# Patient Record
Sex: Female | Born: 1985 | Race: White | Hispanic: No | Marital: Single | Smoking: Current every day smoker
Health system: Southern US, Community
[De-identification: ages and names within clinical notes are randomized; demographics above are authoritative.]

## PROBLEM LIST (undated history)

## (undated) DIAGNOSIS — K259 Gastric ulcer, unspecified as acute or chronic, without hemorrhage or perforation: Secondary | ICD-10-CM

## (undated) DIAGNOSIS — J45909 Unspecified asthma, uncomplicated: Secondary | ICD-10-CM

## (undated) DIAGNOSIS — Z8679 Personal history of other diseases of the circulatory system: Secondary | ICD-10-CM

## (undated) DIAGNOSIS — Z98891 History of uterine scar from previous surgery: Secondary | ICD-10-CM

## (undated) DIAGNOSIS — I10 Essential (primary) hypertension: Secondary | ICD-10-CM

## (undated) HISTORY — DX: Personal history of other diseases of the circulatory system: Z86.79

## (undated) HISTORY — DX: Essential (primary) hypertension: I10

## (undated) HISTORY — PX: ADENOIDECTOMY: SUR15

## (undated) HISTORY — DX: Unspecified asthma, uncomplicated: J45.909

## (undated) HISTORY — PX: INCISION AND DRAINAGE: SHX5863

## (undated) HISTORY — DX: History of uterine scar from previous surgery: Z98.891

## (undated) HISTORY — PX: TONSILLECTOMY: SUR1361

---

## 2004-10-04 ENCOUNTER — Emergency Department: Payer: Self-pay | Admitting: Internal Medicine

## 2004-11-22 ENCOUNTER — Emergency Department: Payer: Self-pay | Admitting: Internal Medicine

## 2004-12-14 ENCOUNTER — Emergency Department: Payer: Self-pay | Admitting: General Practice

## 2005-05-22 ENCOUNTER — Emergency Department: Payer: Self-pay | Admitting: Emergency Medicine

## 2005-06-18 ENCOUNTER — Inpatient Hospital Stay: Payer: Self-pay

## 2005-07-19 ENCOUNTER — Emergency Department: Payer: Self-pay | Admitting: Emergency Medicine

## 2005-07-21 ENCOUNTER — Emergency Department: Payer: Self-pay | Admitting: Emergency Medicine

## 2006-03-05 ENCOUNTER — Emergency Department: Payer: Self-pay | Admitting: Emergency Medicine

## 2008-01-15 ENCOUNTER — Emergency Department: Payer: Self-pay | Admitting: Emergency Medicine

## 2008-01-18 ENCOUNTER — Emergency Department: Payer: Self-pay | Admitting: Emergency Medicine

## 2008-01-18 ENCOUNTER — Other Ambulatory Visit: Payer: Self-pay

## 2008-09-10 ENCOUNTER — Ambulatory Visit: Payer: Self-pay | Admitting: Family Medicine

## 2008-12-30 ENCOUNTER — Observation Stay: Payer: Self-pay | Admitting: Obstetrics & Gynecology

## 2009-03-09 ENCOUNTER — Observation Stay: Payer: Self-pay

## 2009-03-14 ENCOUNTER — Observation Stay: Payer: Self-pay

## 2009-03-17 ENCOUNTER — Inpatient Hospital Stay: Payer: Self-pay | Admitting: Obstetrics & Gynecology

## 2009-03-25 ENCOUNTER — Observation Stay: Payer: Self-pay | Admitting: Obstetrics and Gynecology

## 2010-03-24 ENCOUNTER — Emergency Department: Payer: Self-pay | Admitting: Emergency Medicine

## 2010-06-30 ENCOUNTER — Emergency Department: Payer: Self-pay | Admitting: Emergency Medicine

## 2011-02-19 ENCOUNTER — Emergency Department: Payer: Self-pay | Admitting: Internal Medicine

## 2011-05-24 ENCOUNTER — Ambulatory Visit: Payer: Self-pay | Admitting: Family Medicine

## 2012-01-16 ENCOUNTER — Emergency Department: Payer: Self-pay | Admitting: Emergency Medicine

## 2012-01-16 LAB — URINALYSIS, COMPLETE
Bilirubin,UR: NEGATIVE
Blood: NEGATIVE
Glucose,UR: NEGATIVE mg/dL (ref 0–75)
Specific Gravity: 1.023 (ref 1.003–1.030)
WBC UR: 1 /HPF (ref 0–5)

## 2012-01-16 LAB — COMPREHENSIVE METABOLIC PANEL
Albumin: 3.9 g/dL (ref 3.4–5.0)
Anion Gap: 12 (ref 7–16)
Calcium, Total: 8.9 mg/dL (ref 8.5–10.1)
Chloride: 107 mmol/L (ref 98–107)
Co2: 21 mmol/L (ref 21–32)
EGFR (African American): >60
EGFR (Non-African Amer.): >60
Potassium: 4 mmol/L (ref 3.5–5.1)
SGOT(AST): 17 U/L (ref 15–37)
SGPT (ALT): 13 U/L
Sodium: 140 mmol/L (ref 136–145)
Total Protein: 7.8 g/dL (ref 6.4–8.2)

## 2012-01-16 LAB — CBC
HCT: 42.6 % (ref 35.0–47.0)
MCHC: 34.1 g/dL (ref 32.0–36.0)
MCV: 90 fL (ref 80–100)
RBC: 4.72 10*6/uL (ref 3.80–5.20)
WBC: 8.3 10*3/uL (ref 3.6–11.0)

## 2012-01-19 ENCOUNTER — Emergency Department: Payer: Self-pay | Admitting: Emergency Medicine

## 2012-01-19 LAB — LIPASE, BLOOD: Lipase: 47 U/L — ABNORMAL LOW (ref 73–393)

## 2012-01-19 LAB — URINALYSIS, COMPLETE
Glucose,UR: NEGATIVE mg/dL (ref 0–75)
Leukocyte Esterase: NEGATIVE
Nitrite: NEGATIVE
Ph: 5 (ref 4.5–8.0)
Squamous Epithelial: 1
WBC UR: 1 /HPF (ref 0–5)

## 2012-01-19 LAB — COMPREHENSIVE METABOLIC PANEL
Alkaline Phosphatase: 74 U/L (ref 50–136)
Anion Gap: 12 (ref 7–16)
Bilirubin,Total: 0.3 mg/dL (ref 0.2–1.0)
Chloride: 106 mmol/L (ref 98–107)
Co2: 21 mmol/L (ref 21–32)
EGFR (African American): >60
Osmolality: 281 (ref 275–301)
SGOT(AST): 14 U/L — ABNORMAL LOW (ref 15–37)
SGPT (ALT): 15 U/L
Sodium: 139 mmol/L (ref 136–145)
Total Protein: 8.8 g/dL — ABNORMAL HIGH (ref 6.4–8.2)

## 2012-01-19 LAB — CBC
HCT: 47.2 % — ABNORMAL HIGH (ref 35.0–47.0)
HGB: 15.9 g/dL (ref 12.0–16.0)
MCH: 30.2 pg (ref 26.0–34.0)
MCV: 90 fL (ref 80–100)
Platelet: 263 10*3/uL (ref 150–440)
RBC: 5.28 10*6/uL — ABNORMAL HIGH (ref 3.80–5.20)
RDW: 14.2 % (ref 11.5–14.5)

## 2012-01-19 LAB — AMYLASE: Amylase: 32 U/L (ref 25–115)

## 2013-02-07 ENCOUNTER — Emergency Department: Payer: Self-pay | Admitting: Emergency Medicine

## 2013-02-07 LAB — CBC
HCT: 45.5 % (ref 35.0–47.0)
HGB: 15.4 g/dL (ref 12.0–16.0)
Platelet: 294 10*3/uL (ref 150–440)
RBC: 5.04 10*6/uL (ref 3.80–5.20)
RDW: 13.5 % (ref 11.5–14.5)
WBC: 16.3 10*3/uL — ABNORMAL HIGH (ref 3.6–11.0)

## 2013-02-07 LAB — URINALYSIS, COMPLETE
Blood: NEGATIVE
Glucose,UR: NEGATIVE mg/dL (ref 0–75)
Ketone: NEGATIVE
Leukocyte Esterase: NEGATIVE
Nitrite: NEGATIVE
Ph: 6 (ref 4.5–8.0)
RBC,UR: 1 /HPF (ref 0–5)
Specific Gravity: 1.027 (ref 1.003–1.030)
Squamous Epithelial: 3

## 2013-02-07 LAB — COMPREHENSIVE METABOLIC PANEL
Anion Gap: 8 (ref 7–16)
Co2: 24 mmol/L (ref 21–32)
Creatinine: 0.91 mg/dL (ref 0.60–1.30)
Glucose: 123 mg/dL — ABNORMAL HIGH (ref 65–99)
Potassium: 3.7 mmol/L (ref 3.5–5.1)
SGOT(AST): 12 U/L — ABNORMAL LOW (ref 15–37)
Sodium: 137 mmol/L (ref 136–145)
Total Protein: 8.4 g/dL — ABNORMAL HIGH (ref 6.4–8.2)

## 2013-02-08 ENCOUNTER — Emergency Department: Payer: Self-pay | Admitting: Emergency Medicine

## 2013-02-08 LAB — COMPREHENSIVE METABOLIC PANEL
Alkaline Phosphatase: 67 U/L (ref 50–136)
Anion Gap: 8 (ref 7–16)
BUN: 16 mg/dL (ref 7–18)
Bilirubin,Total: 0.4 mg/dL (ref 0.2–1.0)
Calcium, Total: 8.8 mg/dL (ref 8.5–10.1)
Co2: 26 mmol/L (ref 21–32)
EGFR (African American): >60
EGFR (Non-African Amer.): >60
Potassium: 3.6 mmol/L (ref 3.5–5.1)
Sodium: 137 mmol/L (ref 136–145)
Total Protein: 8.2 g/dL (ref 6.4–8.2)

## 2013-02-08 LAB — CBC
HGB: 15.4 g/dL (ref 12.0–16.0)
MCH: 30.3 pg (ref 26.0–34.0)
MCHC: 33.5 g/dL (ref 32.0–36.0)
Platelet: 280 10*3/uL (ref 150–440)

## 2013-02-08 LAB — URINALYSIS, COMPLETE
Blood: NEGATIVE
Ph: 6 (ref 4.5–8.0)
Protein: 30
RBC,UR: <1 /HPF (ref 0–5)
Squamous Epithelial: 2
WBC UR: 4 /HPF (ref 0–5)

## 2013-10-31 ENCOUNTER — Emergency Department: Payer: Self-pay | Admitting: Urology

## 2013-10-31 LAB — COMPREHENSIVE METABOLIC PANEL
ALK PHOS: 79 U/L
ALT: 17 U/L (ref 12–78)
AST: 9 U/L — AB (ref 15–37)
Albumin: 4.2 g/dL (ref 3.4–5.0)
Anion Gap: 6 — ABNORMAL LOW (ref 7–16)
BUN: 17 mg/dL (ref 7–18)
Bilirubin,Total: 0.5 mg/dL (ref 0.2–1.0)
CHLORIDE: 104 mmol/L (ref 98–107)
CO2: 26 mmol/L (ref 21–32)
CREATININE: 0.97 mg/dL (ref 0.60–1.30)
Calcium, Total: 9.4 mg/dL (ref 8.5–10.1)
EGFR (African American): >60
Glucose: 163 mg/dL — ABNORMAL HIGH (ref 65–99)
Osmolality: 277 (ref 275–301)
Potassium: 3.5 mmol/L (ref 3.5–5.1)
Sodium: 136 mmol/L (ref 136–145)
TOTAL PROTEIN: 8.6 g/dL — AB (ref 6.4–8.2)

## 2013-10-31 LAB — URINALYSIS, COMPLETE
BILIRUBIN, UR: NEGATIVE
Bacteria: NONE SEEN
Blood: NEGATIVE
GLUCOSE, UR: NEGATIVE mg/dL (ref 0–75)
LEUKOCYTE ESTERASE: NEGATIVE
NITRITE: NEGATIVE
Ph: 5 (ref 4.5–8.0)
RBC,UR: 1 /HPF (ref 0–5)
SPECIFIC GRAVITY: 1.029 (ref 1.003–1.030)
Squamous Epithelial: 3
WBC UR: 5 /HPF (ref 0–5)

## 2013-10-31 LAB — CBC
HCT: 44.9 % (ref 35.0–47.0)
HGB: 15.3 g/dL (ref 12.0–16.0)
MCH: 30.4 pg (ref 26.0–34.0)
MCHC: 34.1 g/dL (ref 32.0–36.0)
MCV: 89 fL (ref 80–100)
Platelet: 318 10*3/uL (ref 150–440)
RBC: 5.04 10*6/uL (ref 3.80–5.20)
RDW: 13.8 % (ref 11.5–14.5)
WBC: 15.7 10*3/uL — AB (ref 3.6–11.0)

## 2013-10-31 LAB — HCG, QUANTITATIVE, PREGNANCY: Beta Hcg, Quant.: <1 m[IU]/mL — ABNORMAL LOW

## 2013-10-31 LAB — BETA-HYDROXYBUTYRIC ACID: BETA-HYDROXYBUTYRATE: 1.61 mg/dL (ref 0.2–2.8)

## 2013-10-31 LAB — LIPASE, BLOOD: LIPASE: 59 U/L — AB (ref 73–393)

## 2013-11-04 ENCOUNTER — Emergency Department: Payer: Self-pay | Admitting: Emergency Medicine

## 2013-11-04 LAB — COMPREHENSIVE METABOLIC PANEL
ANION GAP: 6 — AB (ref 7–16)
Albumin: 4 g/dL (ref 3.4–5.0)
Alkaline Phosphatase: 66 U/L
BILIRUBIN TOTAL: 0.3 mg/dL (ref 0.2–1.0)
BUN: 16 mg/dL (ref 7–18)
CO2: 27 mmol/L (ref 21–32)
Calcium, Total: 9 mg/dL (ref 8.5–10.1)
Chloride: 102 mmol/L (ref 98–107)
Creatinine: 0.93 mg/dL (ref 0.60–1.30)
EGFR (African American): >60
EGFR (Non-African Amer.): >60
GLUCOSE: 112 mg/dL — AB (ref 65–99)
Osmolality: 272 (ref 275–301)
POTASSIUM: 3.6 mmol/L (ref 3.5–5.1)
SGOT(AST): 15 U/L (ref 15–37)
SGPT (ALT): 17 U/L (ref 12–78)
Sodium: 135 mmol/L — ABNORMAL LOW (ref 136–145)
Total Protein: 7.8 g/dL (ref 6.4–8.2)

## 2013-11-04 LAB — CBC WITH DIFFERENTIAL/PLATELET
Basophil #: 0.1 10*3/uL (ref 0.0–0.1)
Basophil %: 0.5 %
EOS ABS: 0.1 10*3/uL (ref 0.0–0.7)
EOS PCT: 0.4 %
HCT: 47.8 % — ABNORMAL HIGH (ref 35.0–47.0)
HGB: 15.8 g/dL (ref 12.0–16.0)
Lymphocyte #: 1.5 10*3/uL (ref 1.0–3.6)
Lymphocyte %: 10.8 %
MCH: 29.8 pg (ref 26.0–34.0)
MCHC: 33.1 g/dL (ref 32.0–36.0)
MCV: 90 fL (ref 80–100)
MONO ABS: 1 x10 3/mm — AB (ref 0.2–0.9)
Monocyte %: 7.1 %
NEUTROS ABS: 11 10*3/uL — AB (ref 1.4–6.5)
Neutrophil %: 81.2 %
PLATELETS: 280 10*3/uL (ref 150–440)
RBC: 5.3 10*6/uL — ABNORMAL HIGH (ref 3.80–5.20)
RDW: 13.4 % (ref 11.5–14.5)
WBC: 13.6 10*3/uL — ABNORMAL HIGH (ref 3.6–11.0)

## 2013-11-04 LAB — LIPASE, BLOOD: Lipase: 70 U/L — ABNORMAL LOW (ref 73–393)

## 2015-07-20 ENCOUNTER — Encounter: Payer: Self-pay | Admitting: Emergency Medicine

## 2015-07-20 ENCOUNTER — Emergency Department
Admission: EM | Admit: 2015-07-20 | Discharge: 2015-07-20 | Discharge status: Against Medical Advice | Payer: Self-pay | Attending: Emergency Medicine | Admitting: Emergency Medicine

## 2015-07-20 DIAGNOSIS — Z720 Tobacco use: Secondary | ICD-10-CM | POA: Insufficient documentation

## 2015-07-20 DIAGNOSIS — K5900 Constipation, unspecified: Secondary | ICD-10-CM | POA: Insufficient documentation

## 2015-07-20 NOTE — ED Notes (Signed)
Called no answer in lobby  

## 2015-07-20 NOTE — ED Notes (Signed)
Pt to ed with c/o constipation x 2 days.

## 2016-07-28 ENCOUNTER — Encounter: Payer: Self-pay | Admitting: Emergency Medicine

## 2016-07-28 ENCOUNTER — Emergency Department
Admission: EM | Admit: 2016-07-28 | Discharge: 2016-07-28 | Disposition: A | Discharge status: Home / Self Care | Payer: Self-pay | Attending: Emergency Medicine | Admitting: Emergency Medicine

## 2016-07-28 DIAGNOSIS — R112 Nausea with vomiting, unspecified: Secondary | ICD-10-CM | POA: Insufficient documentation

## 2016-07-28 DIAGNOSIS — F17200 Nicotine dependence, unspecified, uncomplicated: Secondary | ICD-10-CM | POA: Insufficient documentation

## 2016-07-28 DIAGNOSIS — R1084 Generalized abdominal pain: Secondary | ICD-10-CM | POA: Insufficient documentation

## 2016-07-28 DIAGNOSIS — R197 Diarrhea, unspecified: Secondary | ICD-10-CM | POA: Insufficient documentation

## 2016-07-28 DIAGNOSIS — Z5321 Procedure and treatment not carried out due to patient leaving prior to being seen by health care provider: Secondary | ICD-10-CM | POA: Insufficient documentation

## 2016-07-28 HISTORY — DX: Gastric ulcer, unspecified as acute or chronic, without hemorrhage or perforation: K25.9

## 2016-07-28 LAB — COMPREHENSIVE METABOLIC PANEL
ALBUMIN: 4.4 g/dL (ref 3.5–5.0)
ALT: 12 U/L — AB (ref 14–54)
AST: 17 U/L (ref 15–41)
Alkaline Phosphatase: 64 U/L (ref 38–126)
Anion gap: 9 (ref 5–15)
BUN: 12 mg/dL (ref 6–20)
CHLORIDE: 106 mmol/L (ref 101–111)
CO2: 24 mmol/L (ref 22–32)
CREATININE: 0.76 mg/dL (ref 0.44–1.00)
Calcium: 9.4 mg/dL (ref 8.9–10.3)
GFR calc Af Amer: >60 mL/min (ref >60)
GFR calc non Af Amer: >60 mL/min (ref >60)
Glucose, Bld: 132 mg/dL — ABNORMAL HIGH (ref 65–99)
Potassium: 3.8 mmol/L (ref 3.5–5.1)
SODIUM: 139 mmol/L (ref 135–145)
Total Bilirubin: 0.7 mg/dL (ref 0.3–1.2)
Total Protein: 8.1 g/dL (ref 6.5–8.1)

## 2016-07-28 LAB — URINALYSIS COMPLETE WITH MICROSCOPIC (ARMC ONLY)
BILIRUBIN URINE: NEGATIVE
Bacteria, UA: NONE SEEN
GLUCOSE, UA: NEGATIVE mg/dL
Hgb urine dipstick: NEGATIVE
Leukocytes, UA: NEGATIVE
Nitrite: NEGATIVE
PH: 5 (ref 5.0–8.0)
Protein, ur: 30 mg/dL — AB
Specific Gravity, Urine: 1.027 (ref 1.005–1.030)

## 2016-07-28 LAB — CBC
HCT: 46.2 % (ref 35.0–47.0)
Hemoglobin: 15.8 g/dL (ref 12.0–16.0)
MCH: 30.2 pg (ref 26.0–34.0)
MCHC: 34.2 g/dL (ref 32.0–36.0)
MCV: 88.3 fL (ref 80.0–100.0)
PLATELETS: 266 10*3/uL (ref 150–440)
RBC: 5.23 MIL/uL — ABNORMAL HIGH (ref 3.80–5.20)
RDW: 14.1 % (ref 11.5–14.5)
WBC: 10.9 10*3/uL (ref 3.6–11.0)

## 2016-07-28 LAB — LIPASE, BLOOD: LIPASE: 12 U/L (ref 11–51)

## 2016-07-28 MED ORDER — ONDANSETRON 4 MG PO TBDP
4.0000 mg | ORAL_TABLET | Freq: Once | ORAL | Status: AC | PRN
  Administered 2016-07-28: 4 mg via ORAL

## 2016-07-28 MED ORDER — ONDANSETRON 4 MG PO TBDP
ORAL_TABLET | ORAL | Status: AC
  Filled 2016-07-28: qty 1

## 2016-07-28 NOTE — ED Triage Notes (Signed)
Pt presents to ED with reports of generalized abdominal pain, nausea, vomiting and bloody diarrhea. Pt states she first noticed blood in diarrhea this morning.

## 2016-07-28 NOTE — ED Notes (Signed)
Pt called x4 in waiting room and subwait with no answer.

## 2020-02-22 ENCOUNTER — Other Ambulatory Visit: Payer: Self-pay

## 2020-02-22 ENCOUNTER — Encounter: Payer: Self-pay | Admitting: Emergency Medicine

## 2020-02-22 DIAGNOSIS — F17210 Nicotine dependence, cigarettes, uncomplicated: Secondary | ICD-10-CM | POA: Insufficient documentation

## 2020-02-22 DIAGNOSIS — F1590 Other stimulant use, unspecified, uncomplicated: Secondary | ICD-10-CM | POA: Insufficient documentation

## 2020-02-22 DIAGNOSIS — R233 Spontaneous ecchymoses: Secondary | ICD-10-CM | POA: Insufficient documentation

## 2020-02-22 DIAGNOSIS — R519 Headache, unspecified: Secondary | ICD-10-CM | POA: Insufficient documentation

## 2020-02-22 DIAGNOSIS — R438 Other disturbances of smell and taste: Secondary | ICD-10-CM | POA: Insufficient documentation

## 2020-02-22 LAB — BASIC METABOLIC PANEL
Anion gap: 5 (ref 5–15)
BUN: 17 mg/dL (ref 6–20)
CO2: 24 mmol/L (ref 22–32)
Calcium: 8.5 mg/dL — ABNORMAL LOW (ref 8.9–10.3)
Chloride: 108 mmol/L (ref 98–111)
Creatinine, Ser: 0.9 mg/dL (ref 0.44–1.00)
GFR calc Af Amer: >60 mL/min (ref >60)
GFR calc non Af Amer: >60 mL/min (ref >60)
Glucose, Bld: 145 mg/dL — ABNORMAL HIGH (ref 70–99)
Potassium: 3.9 mmol/L (ref 3.5–5.1)
Sodium: 137 mmol/L (ref 135–145)

## 2020-02-22 LAB — CBC WITH DIFFERENTIAL/PLATELET
Abs Immature Granulocytes: 0.04 10*3/uL (ref 0.00–0.07)
Basophils Absolute: 0.1 10*3/uL (ref 0.0–0.1)
Basophils Relative: 1 %
Eosinophils Absolute: 0.1 10*3/uL (ref 0.0–0.5)
Eosinophils Relative: 1 %
HCT: 47.7 % — ABNORMAL HIGH (ref 36.0–46.0)
Hemoglobin: 15.8 g/dL — ABNORMAL HIGH (ref 12.0–15.0)
Immature Granulocytes: 0 %
Lymphocytes Relative: 28 %
Lymphs Abs: 3.6 10*3/uL (ref 0.7–4.0)
MCH: 30.2 pg (ref 26.0–34.0)
MCHC: 33.1 g/dL (ref 30.0–36.0)
MCV: 91.2 fL (ref 80.0–100.0)
Monocytes Absolute: 1 10*3/uL (ref 0.1–1.0)
Monocytes Relative: 8 %
Neutro Abs: 8.2 10*3/uL — ABNORMAL HIGH (ref 1.7–7.7)
Neutrophils Relative %: 62 %
Platelets: 370 10*3/uL (ref 150–400)
RBC: 5.23 MIL/uL — ABNORMAL HIGH (ref 3.87–5.11)
RDW: 14 % (ref 11.5–15.5)
WBC: 13.1 10*3/uL — ABNORMAL HIGH (ref 4.0–10.5)
nRBC: 0 % (ref 0.0–0.2)

## 2020-02-22 LAB — ETHANOL: Alcohol, Ethyl (B): <10 mg/dL (ref <10)

## 2020-02-22 LAB — SALICYLATE LEVEL: Salicylate Lvl: <7 mg/dL — ABNORMAL LOW (ref 7.0–30.0)

## 2020-02-22 LAB — URINE DRUG SCREEN, QUALITATIVE (ARMC ONLY)
Amphetamines, Ur Screen: POSITIVE — AB
Barbiturates, Ur Screen: NOT DETECTED
Benzodiazepine, Ur Scrn: NOT DETECTED
Cannabinoid 50 Ng, Ur ~~LOC~~: POSITIVE — AB
Cocaine Metabolite,Ur ~~LOC~~: NOT DETECTED
MDMA (Ecstasy)Ur Screen: NOT DETECTED
Methadone Scn, Ur: NOT DETECTED
Opiate, Ur Screen: NOT DETECTED
Phencyclidine (PCP) Ur S: NOT DETECTED
Tricyclic, Ur Screen: NOT DETECTED

## 2020-02-22 LAB — ACETAMINOPHEN LEVEL: Acetaminophen (Tylenol), Serum: <10 ug/mL — ABNORMAL LOW (ref 10–30)

## 2020-02-22 LAB — POCT PREGNANCY, URINE: Preg Test, Ur: NEGATIVE

## 2020-02-22 NOTE — ED Triage Notes (Signed)
Pt arrives ambulatory to triage with c/o possible arsinic poisoning. Pt states that she knows of no one who would poison her but read on the Internet that yellow skin and easily bruising are signs and symptoms of arsinic poisoning. Pt states that son has had a lot of abdominal pain which comes and goes, she has a metallic taste in her mouth, she has cloudy urine, and dark circles under her eyes. Pt reports that she and her husband live with another couple and her children and states that pt's boyfriend just moved in and now he's changing colors. Pt just wants to be tested for arsinic at this time. Pt is in NAD and admits to trying meth for the first time x 2 weeks ago and has not felt the same since that time. Pt states that symptoms have been going on x 2 months.

## 2020-02-23 ENCOUNTER — Emergency Department
Admission: EM | Admit: 2020-02-23 | Discharge: 2020-02-23 | Disposition: A | Discharge status: Home / Self Care | Payer: Self-pay | Attending: Emergency Medicine | Admitting: Emergency Medicine

## 2020-02-23 DIAGNOSIS — R233 Spontaneous ecchymoses: Secondary | ICD-10-CM

## 2020-02-23 DIAGNOSIS — R519 Headache, unspecified: Secondary | ICD-10-CM

## 2020-02-23 DIAGNOSIS — R238 Other skin changes: Secondary | ICD-10-CM

## 2020-02-23 LAB — HEPATIC FUNCTION PANEL
ALT: 15 U/L (ref 0–44)
AST: 17 U/L (ref 15–41)
Albumin: 3.5 g/dL (ref 3.5–5.0)
Alkaline Phosphatase: 49 U/L (ref 38–126)
Bilirubin, Direct: <0.1 mg/dL (ref 0.0–0.2)
Total Bilirubin: 0.7 mg/dL (ref 0.3–1.2)
Total Protein: 6.3 g/dL — ABNORMAL LOW (ref 6.5–8.1)

## 2020-02-23 LAB — POCT PREGNANCY, URINE: Preg Test, Ur: NEGATIVE

## 2020-02-23 NOTE — Discharge Instructions (Addendum)
Please seek medical attention for any high fevers, chest pain, shortness of breath, change in behavior, persistent vomiting, bloody stool or any other new or concerning symptoms.  

## 2020-02-23 NOTE — ED Notes (Signed)
ED Provider at bedside. 

## 2020-02-23 NOTE — ED Provider Notes (Signed)
[facility] Emergency Department Provider Note  ____________________________________________   I have reviewed the triage vital signs and the nursing notes.   HISTORY  Chief Complaint Possible Arsonic Poisoning   History limited by: Not Limited   HPI Kelly Stafford is a 34 y.o. female who presents to the emergency department today with concern for possible arsenic poisoning. Patient states that over the past few months she has noticed a variety of symptoms. Her symptoms are quite varied. Some of her symptoms include headaches and migraines, easy bruising, people thinking that her skin is yellow, abnormal taste in her mouth amongst other symptoms. Patient states that she looked up online and is concerned that she has arsenic poisoning. Additionally she states that a couple of nights ago she noticed smoke/gas in her room.    Records reviewed. Per medical record review patient has a history of gastric ulcer.  Past Medical History:  Diagnosis Date  . Gastric ulcer     There are no problems to display for this patient.   Past Surgical History:  Procedure Laterality Date  . TONSILLECTOMY      Prior to Admission medications   Not on File    Allergies Patient has no known allergies.  No family history on file.  Social History Social History   Tobacco Use  . Smoking status: Current Every Day Smoker    Packs/day: 0.50  . Smokeless tobacco: Never Used  Substance Use Topics  . Alcohol use: No  . Drug use: Yes    Types: Methamphetamines    Comment: x 2 weeks ago    Review of Systems Constitutional: No fever/chills Eyes: No visual changes. ENT: Positive for abnormal taste in her mouth. Cardiovascular: Denies chest pain. Respiratory: Denies shortness of breath. Gastrointestinal: Positive for abdominal pain. Genitourinary: Negative for dysuria. Musculoskeletal: Negative for back pain. Skin: Positive for yellow skin. Neurological: Positive  for headaches. ____________________________________________   PHYSICAL EXAM:  VITAL SIGNS: ED Triage Vitals  Enc Vitals Group     BP 02/22/20 2119 (!) 188/103     Pulse Rate 02/22/20 2119 (!) 105     Resp 02/22/20 2119 18     Temp 02/22/20 2119 98.6 F (37 C)     Temp Source 02/22/20 2119 Oral     SpO2 02/22/20 2119 99 %     Weight 02/22/20 2121 206 lb (93.4 kg)     Height 02/22/20 2121 5\' 3"  (1.6 m)     Head Circumference --      Peak Flow --      Pain Score 02/22/20 2120 9   Constitutional: Alert and oriented.  Eyes: Conjunctivae are normal.  ENT      Head: Normocephalic and atraumatic.      Nose: No congestion/rhinnorhea.      Mouth/Throat: Mucous membranes are moist.      Neck: No stridor. Hematological/Lymphatic/Immunilogical: No cervical lymphadenopathy. Cardiovascular: Normal rate, regular rhythm.  No murmurs, rubs, or gallops.  Respiratory: Normal respiratory effort without tachypnea nor retractions. Breath sounds are clear and equal bilaterally. No wheezes/rales/rhonchi. Gastrointestinal: Soft and non tender. No rebound. No guarding.  Genitourinary: Deferred Musculoskeletal: Normal range of motion in all extremities. No lower extremity edema. Neurologic:  Normal speech and language. No gross focal neurologic deficits are appreciated.  Skin:  Skin is warm, dry and intact. No rash noted. Psychiatric: Mood and affect are normal. Speech and behavior are normal. Patient exhibits appropriate insight and judgment.  ____________________________________________    LABS (pertinent  positives/negatives)  Upreg negative Acetaminophen, ethanol, salicylate below threshold BMP wnl except glu 145, ca 8.5 UDS positive amphetamines, cannabinoid CBC wbc 13.1, hgb 15.8, plt 370 LFTs wnl except t protein 6.3 ____________________________________________   EKG  None  ____________________________________________     RADIOLOGY  None  ____________________________________________   PROCEDURES  Procedures  ____________________________________________   INITIAL IMPRESSION / ASSESSMENT AND PLAN / ED COURSE  Pertinent labs & imaging results that were available during my care of the patient were reviewed by me and considered in my medical decision making (see chart for details).   Patient presented to the emergency department today with concerns for possible arsenic poisoning. Patient has complaints of multiple symptoms states when she looked it up on the Internet she thought it could be arsenic poisoning. Blood work here without concerning findings. I did offer to send out arsenic testing for the patient however I explained it would be a send out. This time she felt comfortable deferring. Discussed with patient portance of following up with primary care. ____________________________________________   FINAL CLINICAL IMPRESSION(S) / ED DIAGNOSES  Final diagnoses:  Easy bruising  Nonintractable headache, unspecified chronicity pattern, unspecified headache type     Note: This dictation was prepared with Dragon dictation. Any transcriptional errors that result from this process are unintentional     Phineas Semen, MD 02/23/20 [phone]

## 2020-03-30 ENCOUNTER — Ambulatory Visit (LOCAL_COMMUNITY_HEALTH_CENTER): Payer: Self-pay

## 2020-03-30 ENCOUNTER — Other Ambulatory Visit: Payer: Self-pay

## 2020-03-30 VITALS — BP 118/76 | Ht 63.0 in | Wt 215.0 lb

## 2020-03-30 DIAGNOSIS — Z3202 Encounter for pregnancy test, result negative: Secondary | ICD-10-CM

## 2020-03-30 LAB — PREGNANCY, URINE: Preg Test, Ur: NEGATIVE

## 2020-03-30 MED ORDER — MULTI-VITAMIN/MINERALS PO TABS: 1.0000 | ORAL_TABLET | Freq: Every day | ORAL | 0 refills | Status: DC

## 2020-03-30 NOTE — Progress Notes (Signed)
Initial Mirena inserted ~ 6 weeks pp 2010. In 2011, returned to [facility] for exam and IUD inadvertently removed during PAP. Mirena reinserted at that time. Client does not do a monthly string check. Client reports has been feeling pregnancy symptoms which is what prompted UPT (negative). Reports light vaginal spotting on monthly basis, but none since end of March. Jossie Ng, RN  Appt for physical and IUD removal scheduled for 04/02/2020 and reminder card given. Per client, desires to switch to ocps. Last sex (withouot condom) 03/25/2020 and client counseled to abstain until appt with provider 04/02/20. Marland Kitchenmectred

## 2020-04-02 ENCOUNTER — Ambulatory Visit (LOCAL_COMMUNITY_HEALTH_CENTER): Payer: Self-pay | Admitting: Advanced Practice Midwife

## 2020-04-02 ENCOUNTER — Other Ambulatory Visit: Payer: Self-pay

## 2020-04-02 ENCOUNTER — Encounter: Payer: Self-pay | Admitting: Advanced Practice Midwife

## 2020-04-02 VITALS — BP 134/97 | Ht 62.0 in | Wt 217.0 lb

## 2020-04-02 DIAGNOSIS — R030 Elevated blood-pressure reading, without diagnosis of hypertension: Secondary | ICD-10-CM

## 2020-04-02 DIAGNOSIS — K259 Gastric ulcer, unspecified as acute or chronic, without hemorrhage or perforation: Secondary | ICD-10-CM | POA: Insufficient documentation

## 2020-04-02 DIAGNOSIS — F1290 Cannabis use, unspecified, uncomplicated: Secondary | ICD-10-CM

## 2020-04-02 DIAGNOSIS — F17200 Nicotine dependence, unspecified, uncomplicated: Secondary | ICD-10-CM

## 2020-04-02 DIAGNOSIS — Z30011 Encounter for initial prescription of contraceptive pills: Secondary | ICD-10-CM

## 2020-04-02 DIAGNOSIS — F1190 Opioid use, unspecified, uncomplicated: Secondary | ICD-10-CM

## 2020-04-02 DIAGNOSIS — Z3009 Encounter for other general counseling and advice on contraception: Secondary | ICD-10-CM

## 2020-04-02 DIAGNOSIS — F1120 Opioid dependence, uncomplicated: Secondary | ICD-10-CM

## 2020-04-02 DIAGNOSIS — O09299 Supervision of pregnancy with other poor reproductive or obstetric history, unspecified trimester: Secondary | ICD-10-CM

## 2020-04-02 DIAGNOSIS — E669 Obesity, unspecified: Secondary | ICD-10-CM

## 2020-04-02 LAB — WET PREP FOR TRICH, YEAST, CLUE
Trichomonas Exam: NEGATIVE
Yeast Exam: NEGATIVE

## 2020-04-02 MED ORDER — MULTIVITAMINS PO CAPS: 1.0000 | ORAL_CAPSULE | Freq: Every day | ORAL | 0 refills | Status: DC

## 2020-04-02 MED ORDER — NORETHINDRONE 0.35 MG PO TABS: 1.0000 | ORAL_TABLET | Freq: Every day | ORAL | 0 refills | Status: DC

## 2020-04-02 NOTE — Progress Notes (Signed)
Family Planning Visit- Initial Visit  Subjective:  Kelly Stafford is a 34 y.o. seperated WF G1P1001 smoker   being seen today for an initial well woman visit and to discuss family planning options and IUD removal.  She is currently using Mirena IUD placed 2010 for pregnancy prevention. Patient reports she does want a pregnancy in the next year.  Patient has the following medical conditions has Obesity BMI=39.6; Elevated blood pressure reading without diagnosis of hypertension 137/96, 134/97 on 04/02/20;  Meth use--last 01/2020; Marijuana use daily; Smoker 1/2-1 ppd; and Hx of preeclampsia on their problem list.  Chief Complaint  Patient presents with  . Contraception  . Procedure    Patient reports Mirena IUD inserted [facility] 2010. LMP 12/2019.  Last sex yesterday without condom.  Thinks wants pregnancy but wants ocp's "just in case I don't".  Living with her boyfriend, her 66 yo son, roommate and her 4 kids.  Not employed.  Last MJ yesterday.  Last meth 01/2020.  Last ETOH 03/29/20 (2 Malt beverages) 1x/mo.  Smokes 1/2-1 ppd.  BP 137/96, 134/97.  No pap or PE on file found.  Pt reports abnormal pap 2009? Pt reports stomach ulcer dx'd 2006 without meds.  Patient denies any chronic medical conditions   Body mass index is 39.69 kg/m. - Patient is eligible for diabetes screening based on BMI and age >99?  not applicable BZ1I ordered? not applicable  Patient reports 2 of partners in last year. Desires STI screening?  Yes  Has patient been screened once for HCV in the past?  No  No results found for: HCVAB  Does the patient have current of drug use, have a partner with drug use, and/or has been incarcerated since last result? Yes  If yes-- Screen for HCV through [facility]   Does the patient meet criteria for HBV testing? Yes  Criteria:  -Household, sexual or needle sharing contact with HBV -History of drug use -HIV positive -Those with known Hep C   Health Maintenance Due  Topic Date  Due  . Hepatitis C Screening  Never done  . COVID-19 Vaccine (1) Never done  . HIV Screening  Never done  . TETANUS/TDAP  Never done  . PAP SMEAR-Modifier  Never done    Review of Systems  Neurological: Positive for dizziness (after gives plasma) and headaches (2-3x/wk, -N&V, -neuro sxs, -visual/auditory).  Endo/Heme/Allergies: Bruises/bleeds easily (past few months).  All other systems reviewed and are negative.   The following portions of the patient's history were reviewed and updated as appropriate: allergies, current medications, past family history, past medical history, past social history, past surgical history and problem list. Problem list updated.   See flowsheet for other program required questions.  Objective:   Vitals:   04/02/20 0831 04/02/20 0855  BP: (!) 137/96 (!) 134/97  Weight: 217 lb (98.4 kg)   Height: 5\' 2"  (1.575 m)     Physical Exam Constitutional:      Appearance: Normal appearance. She is obese.  HENT:     Head: Normocephalic and atraumatic.     Mouth/Throat:     Mouth: Mucous membranes are moist.  Eyes:     Conjunctiva/sclera: Conjunctivae normal.  Cardiovascular:     Rate and Rhythm: Normal rate and regular rhythm.  Pulmonary:     Effort: Pulmonary effort is normal.     Breath sounds: Normal breath sounds.  Chest:     Breasts:        Right: Normal.  Left: Normal.  Abdominal:     Palpations: Abdomen is soft.  Genitourinary:    General: Normal vulva.     Exam position: Lithotomy position.     Vagina: Vaginal discharge (scant leukorrhea, ph>4.5) present.     Cervix: Friability (friable to pap) present.     Uterus: Normal.      Adnexa: Right adnexa normal and left adnexa normal.     Rectum: Normal.     Comments: Mirena IUD easily removed and shown to pt Musculoskeletal:        General: Normal range of motion.     Cervical back: Normal range of motion and neck supple.  Skin:    General: Skin is warm and dry.  Neurological:      Mental Status: She is alert.  Psychiatric:        Mood and Affect: Mood normal.       Assessment and Plan:  Kelly Stafford is a 34 y.o. female presenting to the [facility] Department for an initial well woman exam/family planning visit  Contraception counseling: Reviewed all forms of birth control options in the tiered based approach. available including abstinence; over the counter/barrier methods; hormonal contraceptive medication including pill, patch, ring, injection,contraceptive implant, ECP; hormonal and nonhormonal IUDs; permanent sterilization options including vasectomy and the various tubal sterilization modalities. Risks, benefits, and typical effectiveness rates were reviewed.  Questions were answered.  Written information was also given to the patient to review.  Patient desires ocp's, this was prescribed for patient. She will follow up in 3 months for surveillance.  She was told to call with any further questions, or with any concerns about this method of contraception.  Emphasized use of condoms 100% of the time for STI prevention.  Patient was offered ECP. ECP was not accepted by the patient. ECP counseling was not given - see RN documentation  1. Obesity, unspecified classification, unspecified obesity type, unspecified whether serious comorbidity present   2. Family planning Treat wet mount per standing orders Immunization nurse consult   IUD Removal  Patient identified, informed consent performed, consent signed.  Patient was in the dorsal lithotomy position, normal external genitalia was noted.  A speculum was placed in the patient's vagina, normal discharge was noted, no lesions. The cervix was visualized, no lesions, no abnormal discharge.  The strings of the IUD were grasped and pulled using ring forceps. The IUD was removed in its entirety.  Patient tolerated the procedure well.    Patient will use ocp's for contraception/plans for pregnancy soon  and she was told to avoid teratogens, take PNV and folic acid.  Routine preventative health maintenance measures emphasized.  - WET PREP FOR TRICH, YEAST, CLUE - Syphilis Serology, [place] [facility] - HIV [place] [facility] - Chlamydia/Gonorrhea [place] [facility] - IGP, [facility] HPV  3. Encounter for initial prescription of contraceptive pills Micronor #3 I po daily to begin today Please counsel pt to take same time daily and abstinance/back up condoms next 7 days  4. Elevated blood pressure reading without diagnosis of hypertension 137/96, 134/97 on 04/02/20 Referred to Primary Care MD--please give pt list  5.  Meth use--last 01/2020 Counseled not to use  6. Marijuana use daily Counseled not to use  7. Smoker 1/2-1 ppd Counseled via 5 A's to stop smoking Please give 1800# to pt  8. Hx of preeclampsia     Return in about 3 months (around 07/03/2020).  No future appointments.  Alberteen Spindle, CNM

## 2020-04-02 NOTE — Progress Notes (Addendum)
[employer] results reviewed. Per standing orders no treatment indicated. Quitline information given. Tawny Hopping, RN

## 2020-04-02 NOTE — Progress Notes (Signed)
Here today for PE and IUD removal. No records here for PE's or Paps. Interested in OCP's for alternate birth control. Wants HIV/Syphilis bloodwork today. Tawny Hopping, RN

## 2020-04-05 LAB — IGP, APTIMA HPV
HPV Aptima: POSITIVE — AB
PAP Smear Comment: 0

## 2020-04-06 ENCOUNTER — Encounter: Payer: Self-pay | Admitting: Advanced Practice Midwife

## 2020-04-06 DIAGNOSIS — R87619 Unspecified abnormal cytological findings in specimens from cervix uteri: Secondary | ICD-10-CM

## 2020-04-06 HISTORY — DX: Unspecified abnormal cytological findings in specimens from cervix uteri: R87.619

## 2020-05-12 ENCOUNTER — Telehealth: Payer: Self-pay | Admitting: General Practice

## 2020-05-12 NOTE — Telephone Encounter (Signed)
Individual has been contacted 3+ times regarding ED referral. No further attempts to contact individual will be made. 

## 2020-06-05 ENCOUNTER — Other Ambulatory Visit: Payer: Self-pay

## 2020-06-05 ENCOUNTER — Ambulatory Visit (LOCAL_COMMUNITY_HEALTH_CENTER): Payer: Self-pay

## 2020-06-05 VITALS — BP 124/79 | HR 92 | Ht 62.0 in | Wt 212.0 lb

## 2020-06-05 DIAGNOSIS — Z3202 Encounter for pregnancy test, result negative: Secondary | ICD-10-CM

## 2020-06-05 LAB — PREGNANCY, URINE: Preg Test, Ur: NEGATIVE
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
## 2021-01-26 ENCOUNTER — Other Ambulatory Visit: Payer: Self-pay

## 2021-01-26 ENCOUNTER — Ambulatory Visit (LOCAL_COMMUNITY_HEALTH_CENTER): Payer: Self-pay

## 2021-01-26 VITALS — BP 137/80 | Ht 63.0 in | Wt 224.5 lb

## 2021-01-26 DIAGNOSIS — Z3201 Encounter for pregnancy test, result positive: Secondary | ICD-10-CM

## 2021-01-26 LAB — PREGNANCY, URINE: Preg Test, Ur: POSITIVE — AB

## 2021-01-26 MED ORDER — PRENATAL 27-0.8 MG PO TABS: 1.0000 | ORAL_TABLET | Freq: Every day | ORAL | 0 refills | Status: AC

## 2021-01-26 NOTE — Progress Notes (Signed)
UPT positive. Plans prenatal care at [facility]. Encouraged to establish prenatal care ASAP as she may be considered high risk  d/t hx HBP during last preg. Pt in agreement. To DSS clerk for Medicaid/Preg women. Jerel Shepherd, RN

## 2021-04-01 ENCOUNTER — Encounter: Payer: Self-pay | Admitting: Nurse Practitioner

## 2021-05-03 ENCOUNTER — Telehealth: Payer: Self-pay | Admitting: Nurse Practitioner

## 2021-05-03 NOTE — Telephone Encounter (Signed)
Telephone call to patient regarding PAP follow up.  Patient informed that her PAP was due.  Patient stated that she was now receiving care through [facility] and had recently suffered a miscarriage. Patient to call [facility] and schedule an appointment.  Glenna Fellows, RN

## 2021-11-12 ENCOUNTER — Emergency Department: Payer: Medicaid Other

## 2021-11-12 ENCOUNTER — Encounter: Payer: Self-pay | Admitting: Emergency Medicine

## 2021-11-12 ENCOUNTER — Other Ambulatory Visit: Payer: Self-pay

## 2021-11-12 ENCOUNTER — Emergency Department
Admission: EM | Admit: 2021-11-12 | Discharge: 2021-11-12 | Disposition: A | Discharge status: Home / Self Care | Payer: Medicaid Other | Attending: Emergency Medicine | Admitting: Emergency Medicine

## 2021-11-12 DIAGNOSIS — F1590 Other stimulant use, unspecified, uncomplicated: Secondary | ICD-10-CM | POA: Diagnosis not present

## 2021-11-12 DIAGNOSIS — Z20822 Contact with and (suspected) exposure to covid-19: Secondary | ICD-10-CM | POA: Diagnosis not present

## 2021-11-12 DIAGNOSIS — J069 Acute upper respiratory infection, unspecified: Secondary | ICD-10-CM | POA: Diagnosis not present

## 2021-11-12 DIAGNOSIS — H9201 Otalgia, right ear: Secondary | ICD-10-CM | POA: Diagnosis present

## 2021-11-12 LAB — URINE DRUG SCREEN, QUALITATIVE (ARMC ONLY)
Amphetamines, Ur Screen: POSITIVE — AB
Barbiturates, Ur Screen: NOT DETECTED
Benzodiazepine, Ur Scrn: NOT DETECTED
Cannabinoid 50 Ng, Ur ~~LOC~~: POSITIVE — AB
Cocaine Metabolite,Ur ~~LOC~~: NOT DETECTED
MDMA (Ecstasy)Ur Screen: NOT DETECTED
Methadone Scn, Ur: NOT DETECTED
Opiate, Ur Screen: NOT DETECTED
Phencyclidine (PCP) Ur S: NOT DETECTED
Tricyclic, Ur Screen: NOT DETECTED

## 2021-11-12 LAB — URINALYSIS, ROUTINE W REFLEX MICROSCOPIC
Bilirubin Urine: NEGATIVE
Glucose, UA: NEGATIVE mg/dL
Ketones, ur: 80 mg/dL — AB
Leukocytes,Ua: NEGATIVE
Nitrite: NEGATIVE
Protein, ur: 100 mg/dL — AB
RBC / HPF: >50 RBC/hpf — ABNORMAL HIGH (ref 0–5)
Specific Gravity, Urine: 1.034 — ABNORMAL HIGH (ref 1.005–1.030)
pH: 5 (ref 5.0–8.0)

## 2021-11-12 LAB — CBC WITH DIFFERENTIAL/PLATELET
Abs Immature Granulocytes: 0.04 10*3/uL (ref 0.00–0.07)
Basophils Absolute: 0 10*3/uL (ref 0.0–0.1)
Basophils Relative: 0 %
Eosinophils Absolute: 0 10*3/uL (ref 0.0–0.5)
Eosinophils Relative: 0 %
HCT: 43.7 % (ref 36.0–46.0)
Hemoglobin: 14.2 g/dL (ref 12.0–15.0)
Immature Granulocytes: 0 %
Lymphocytes Relative: 16 %
Lymphs Abs: 1.8 10*3/uL (ref 0.7–4.0)
MCH: 29.6 pg (ref 26.0–34.0)
MCHC: 32.5 g/dL (ref 30.0–36.0)
MCV: 91.2 fL (ref 80.0–100.0)
Monocytes Absolute: 1.2 10*3/uL — ABNORMAL HIGH (ref 0.1–1.0)
Monocytes Relative: 10 %
Neutro Abs: 8 10*3/uL — ABNORMAL HIGH (ref 1.7–7.7)
Neutrophils Relative %: 74 %
Platelets: 290 10*3/uL (ref 150–400)
RBC: 4.79 MIL/uL (ref 3.87–5.11)
RDW: 13.3 % (ref 11.5–15.5)
WBC: 11.1 10*3/uL — ABNORMAL HIGH (ref 4.0–10.5)
nRBC: 0 % (ref 0.0–0.2)

## 2021-11-12 LAB — COMPREHENSIVE METABOLIC PANEL
ALT: 17 U/L (ref 0–44)
AST: 19 U/L (ref 15–41)
Albumin: 4 g/dL (ref 3.5–5.0)
Alkaline Phosphatase: 69 U/L (ref 38–126)
Anion gap: 10 (ref 5–15)
BUN: 15 mg/dL (ref 6–20)
CO2: 23 mmol/L (ref 22–32)
Calcium: 8.9 mg/dL (ref 8.9–10.3)
Chloride: 103 mmol/L (ref 98–111)
Creatinine, Ser: 0.74 mg/dL (ref 0.44–1.00)
GFR, Estimated: >60 mL/min (ref >60)
Glucose, Bld: 97 mg/dL (ref 70–99)
Potassium: 4.1 mmol/L (ref 3.5–5.1)
Sodium: 136 mmol/L (ref 135–145)
Total Bilirubin: 0.7 mg/dL (ref 0.3–1.2)
Total Protein: 7.9 g/dL (ref 6.5–8.1)

## 2021-11-12 LAB — RESP PANEL BY RT-PCR (FLU A&B, COVID) ARPGX2
Influenza A by PCR: NEGATIVE
Influenza B by PCR: NEGATIVE
SARS Coronavirus 2 by RT PCR: NEGATIVE

## 2021-11-12 LAB — LACTIC ACID, PLASMA: Lactic Acid, Venous: 1.5 mmol/L (ref 0.5–1.9)

## 2021-11-12 MED ORDER — AMOXICILLIN 875 MG PO TABS: 875.0000 mg | ORAL_TABLET | Freq: Two times a day (BID) | ORAL | 0 refills | Status: DC

## 2021-11-12 MED ORDER — SODIUM CHLORIDE 0.9 % IV BOLUS
1000.0000 mL | Freq: Once | INTRAVENOUS | Status: AC
  Administered 2021-11-12: 1000 mL via INTRAVENOUS

## 2021-11-12 NOTE — ED Provider Notes (Signed)
[facility] Provider Note    Event Date/Time   First MD Initiated Contact with Patient 11/12/21 1024     (approximate)   History   Ear Pain   HPI  Kelly Stafford is a 36 y.o. female presents to the emergency department complaining of right ear pain, sinus congestion, cough, fever and chills for 3 days.  Family members concerned stating that she has been having hallucinations.  States they do not know if she relapsed using meth or if she is hallucinating because she is sick.  States she has been very paranoid and thinks that police are coming after her.  They think mainly that she may be relapsing and using meth again.  Patient does not complain of this or admit to this      Physical Exam   Triage Vital Signs: ED Triage Vitals  Enc Vitals Group     BP 11/12/21 0823 133/80     Pulse Rate 11/12/21 0823 92     Resp 11/12/21 0823 16     Temp 11/12/21 0822 98.5 F (36.9 C)     Temp Source 11/12/21 0822 Oral     SpO2 11/12/21 0823 100 %     Weight 11/12/21 0822 224 lb 6.9 oz (101.8 kg)     Height 11/12/21 0822 5\' 3"  (1.6 m)     Head Circumference --      Peak Flow --      Pain Score 11/12/21 0822 8     Pain Loc --      Pain Edu? --      Excl. in [place]? --     Most recent vital signs: Vitals:   11/12/21 0823 11/12/21 1125  BP: 133/80 (!) 133/95  Pulse: 92 66  Resp: 16 16  Temp: 98.5 F (36.9 C)   SpO2: 100% 100%     General: Awake, no distress.   CV:  Good peripheral perfusion. regular rate and  rhythm Resp:  Normal effort. Lungs CTA Abd:  No distention.   Other:  Patient appears to have chills   ED Results / Procedures / Treatments   Labs (all labs ordered are listed, but only abnormal results are displayed) Labs Reviewed  URINALYSIS, ROUTINE W REFLEX MICROSCOPIC - Abnormal; Notable for the following components:      Result Value   Color, Urine AMBER (*)    APPearance HAZY (*)    Specific Gravity, Urine 1.034 (*)    Hgb urine  dipstick LARGE (*)    Ketones, ur 80 (*)    Protein, ur 100 (*)    RBC / HPF >50 (*)    Bacteria, UA MANY (*)    All other components within normal limits  CBC WITH DIFFERENTIAL/PLATELET - Abnormal; Notable for the following components:   WBC 11.1 (*)    Neutro Abs 8.0 (*)    Monocytes Absolute 1.2 (*)    All other components within normal limits  URINE DRUG SCREEN, QUALITATIVE ([facility] ONLY) - Abnormal; Notable for the following components:   Amphetamines, Ur Screen POSITIVE (*)    Cannabinoid 50 Ng, Ur [place] POSITIVE (*)    All other components within normal limits  RESP PANEL BY RT-PCR (FLU A&B, COVID) ARPGX2  COMPREHENSIVE METABOLIC PANEL  LACTIC ACID, PLASMA  LACTIC ACID, PLASMA     EKG     RADIOLOGY Chest x-ray    PROCEDURES:   Procedures   MEDICATIONS ORDERED IN ED: Medications  sodium chloride 0.9 %  bolus 1,000 mL (1,000 mLs Intravenous New Bag/Given 11/12/21 1114)     IMPRESSION / MDM / ASSESSMENT AND PLAN / ED COURSE  I reviewed the triage vital signs and the nursing notes.                              Differential diagnosis includes, but is not limited to, COVID, influenza, CAP, UTI, drug use  Chest x-ray was reviewed by me, do not see any acute abnormality such as pneumonia.  This is confirmed by radiology Respiratory panel is negative for covid and influenza CBC has elevated WBC of 11.1, neutrophils are also elevated which could indicate a bacterial infection Metabolic panel and lactic acid are both normal do not feel that the patient is septic.  Her urinalysis does show a lot of blood but the patient is currently on her menstrual cycle so this was be the reason for the hematuria. UDS positive for amphetamines and cannabinoids, due to her relapsing into her methamphetamine use this may indicate the paranoia and hallucinations.  I did explain all these findings to the patient and her family member.  She is stable and does not want to be admitted.  Patient  is not suicidal or homicidal so she does not qualify for psychiatric unit.  Patient was encouraged to follow-up with her regular doctor and to stop using drugs.  She is to increase her fluids.  She was given a prescription for amoxicillin for the sinus infection.  She is to return to emergency department for worsening.  She is in agreement treatment plan and discharged in stable condition.      FINAL CLINICAL IMPRESSION(S) / ED DIAGNOSES   Final diagnoses:  Acute URI  Amphetamine use     Rx / DC Orders   ED Discharge Orders          Ordered    amoxicillin (AMOXIL) 875 MG tablet  2 times daily        11/12/21 1208             Note:  This document was prepared using Dragon voice recognition software and may include unintentional dictation errors.    Versie Starks, PA-C 11/12/21 1250    Vladimir Crofts, MD 11/12/21 1600

## 2021-11-12 NOTE — ED Notes (Signed)
Pt to ED for L earache, sore throat, cough, chills since 3 days ago. Has tried ear drops and "some night time medicine".  Warm blanket provided.

## 2021-11-12 NOTE — ED Triage Notes (Signed)
Sinus congestion and left ear pain  x3 days.  Also chills.

## 2021-11-12 NOTE — Discharge Instructions (Addendum)
Follow-up with your regular doctor if not improving in 3 days.  Return emergency department worsening.  Take antibiotic as prescribed.

## 2022-01-05 ENCOUNTER — Other Ambulatory Visit: Payer: Self-pay

## 2022-01-05 ENCOUNTER — Ambulatory Visit (LOCAL_COMMUNITY_HEALTH_CENTER): Payer: Medicaid Other

## 2022-01-05 VITALS — BP 136/82 | Ht 63.0 in | Wt 236.5 lb

## 2022-01-05 DIAGNOSIS — Z3201 Encounter for pregnancy test, result positive: Secondary | ICD-10-CM | POA: Diagnosis not present

## 2022-01-05 LAB — PREGNANCY, URINE: Preg Test, Ur: POSITIVE — AB

## 2022-01-05 MED ORDER — PRENATAL 27-0.8 MG PO TABS: 1.0000 | ORAL_TABLET | Freq: Every day | ORAL | 0 refills | Status: AC

## 2022-01-05 NOTE — Progress Notes (Signed)
UPT positive. Plans prenatal care at [facility]. Sent to DSS for Medicaid/preg women. Jerel Shepherd, RN ? ?

## 2022-01-20 ENCOUNTER — Other Ambulatory Visit: Payer: Self-pay

## 2022-01-20 ENCOUNTER — Emergency Department: Payer: Medicaid Other

## 2022-01-20 ENCOUNTER — Emergency Department
Admission: EM | Admit: 2022-01-20 | Discharge: 2022-01-20 | Disposition: A | Discharge status: Home / Self Care | Payer: Medicaid Other | Attending: Emergency Medicine | Admitting: Emergency Medicine

## 2022-01-20 DIAGNOSIS — O9A211 Injury, poisoning and certain other consequences of external causes complicating pregnancy, first trimester: Secondary | ICD-10-CM | POA: Insufficient documentation

## 2022-01-20 DIAGNOSIS — F419 Anxiety disorder, unspecified: Secondary | ICD-10-CM | POA: Insufficient documentation

## 2022-01-20 DIAGNOSIS — O99511 Diseases of the respiratory system complicating pregnancy, first trimester: Secondary | ICD-10-CM | POA: Insufficient documentation

## 2022-01-20 DIAGNOSIS — Z3A01 Less than 8 weeks gestation of pregnancy: Secondary | ICD-10-CM

## 2022-01-20 DIAGNOSIS — J45909 Unspecified asthma, uncomplicated: Secondary | ICD-10-CM | POA: Insufficient documentation

## 2022-01-20 DIAGNOSIS — X58XXXA Exposure to other specified factors, initial encounter: Secondary | ICD-10-CM | POA: Diagnosis not present

## 2022-01-20 DIAGNOSIS — I10 Essential (primary) hypertension: Secondary | ICD-10-CM | POA: Diagnosis not present

## 2022-01-20 DIAGNOSIS — S39012A Strain of muscle, fascia and tendon of lower back, initial encounter: Secondary | ICD-10-CM | POA: Insufficient documentation

## 2022-01-20 DIAGNOSIS — O99341 Other mental disorders complicating pregnancy, first trimester: Secondary | ICD-10-CM | POA: Diagnosis not present

## 2022-01-20 LAB — URINALYSIS, ROUTINE W REFLEX MICROSCOPIC
Bilirubin Urine: NEGATIVE
Glucose, UA: NEGATIVE mg/dL
Hgb urine dipstick: NEGATIVE
Ketones, ur: NEGATIVE mg/dL
Leukocytes,Ua: NEGATIVE
Nitrite: NEGATIVE
Protein, ur: NEGATIVE mg/dL
Specific Gravity, Urine: 1.006 (ref 1.005–1.030)
pH: 7 (ref 5.0–8.0)

## 2022-01-20 LAB — HCG, QUANTITATIVE, PREGNANCY: hCG, Beta Chain, Quant, S: 71821 m[IU]/mL — ABNORMAL HIGH (ref <5)

## 2022-01-20 NOTE — ED Notes (Signed)
See triage note  presents with lower back pain  states pain is worse on the right and radiates into right leg occasionally  ambulates well  denies any injury  states she is approx [redacted] weeks pregnant   ?

## 2022-01-20 NOTE — ED Triage Notes (Signed)
Pt c/o lower back pain for the past 2 weeks, worse with standing, denies injury. Pt is about [redacted] weeks pregnant, denies any vaginal bleeding ?

## 2022-01-20 NOTE — ED Provider Notes (Signed)
? ?[facility] ?Provider Note ? ? ? Event Date/Time  ? First MD Initiated Contact with Patient 01/20/22 1011   ?  (approximate) ? ? ?History  ? ?Back Pain ? ? ?HPI ? ?Kelly Stafford is a 36 y.o. female presents to the ED with complaint of back pain 2 weeks without history of injury.  Patient denies any urinary symptoms or history of kidney stones.  Patient denies any prior problems with her back.  She states that her dog has had puppies and she has been changing out the pads frequently.  Patient also is approximately [redacted] weeks pregnant and is very anxious as the last time she was pregnant she was carrying twins and was unable to get an ultrasound before 12 weeks which she ended up with a fetal demise x2.  Patient is extremely anxious about this and this might be causing her back pain.  Patient has a history of asthma, hypertension, gastric ulcer and HPV. ?  ? ? ?Physical Exam  ? ?Triage Vital Signs: ?ED Triage Vitals  ?Enc Vitals Group  ?   BP 01/20/22 1005 129/88  ?   Pulse Rate 01/20/22 1005 92  ?   Resp 01/20/22 1005 16  ?   Temp 01/20/22 1005 98.5 ?F (36.9 ?C)  ?   Temp Source 01/20/22 1005 Oral  ?   SpO2 01/20/22 1005 100 %  ?   Weight 01/20/22 1017 236 lb 5.3 oz (107.2 kg)  ?   Height 01/20/22 1017 5\' 3"  (1.6 m)  ?   Head Circumference --   ?   Peak Flow --   ?   Pain Score --   ?   Pain Loc --   ?   Pain Edu? --   ?   Excl. in GC? --   ? ? ?Most recent vital signs: ?Vitals:  ? 01/20/22 1005  ?BP: 129/88  ?Pulse: 92  ?Resp: 16  ?Temp: 98.5 ?F (36.9 ?C)  ?SpO2: 100%  ? ? ? ?General: Awake, no distress.  ?CV:  Good peripheral perfusion.  Heart regular rate and rhythm. ?Resp:  Normal effort.  Clear bilaterally. ?Abd:  No distention.  Soft, nontender, bowel sounds normoactive x4 quadrants. ?Other:  On examination of the back there is tenderness along the paravertebral muscles lower lumbar area bilaterally.  Range of motion is slow and guarded secondary to discomfort.  Patient is ambulatory  without any assistance.  Good muscle strength bilaterally.  No point tenderness on palpation of the lumbar spine or step-offs are noted. ? ? ?ED Results / Procedures / Treatments  ? ?Labs ?(all labs ordered are listed, but only abnormal results are displayed) ?Labs Reviewed  ?URINALYSIS, ROUTINE W REFLEX MICROSCOPIC - Abnormal; Notable for the following components:  ?    Result Value  ? Color, Urine YELLOW (*)   ? APPearance CLEAR (*)   ? All other components within normal limits  ?HCG, QUANTITATIVE, PREGNANCY - Abnormal; Notable for the following components:  ? hCG, Beta 01/22/22, Nyra Jabs [place] (*)   ? All other components within normal limits  ? ? ? ? ?RADIOLOGY ? ?Ultrasound images were reviewed by myself with noted pregnancy.  Radiology report estimates 6 weeks 3 days single IUP and a cyst noted on the right ovary. ? ? ?PROCEDURES: ? ?Critical Care performed:  ? ?Procedures ? ? ?MEDICATIONS ORDERED IN ED: ?Medications - No data to display ? ? ?IMPRESSION / MDM / ASSESSMENT AND PLAN / ED COURSE  ?  I reviewed the triage vital signs and the nursing notes. ? ? ?Differential diagnosis includes, but is not limited to, back pain, lumbar strain, first trimester pregnancy, anxiety about pregnancy. ? ?36 year old female presents to the ED with complaint of back pain.  Patient is unaware of any known injury to her back but states that her dog has puppies and she has been changing pads frequently.  Patient also is approximately [redacted] weeks pregnant and states that she is very anxious about her pregnancy as the last pregnancy was with twins and she was unable to get an ultrasound until she found out that both twins were nonviable and she had to chemically abort the baby's.  Urinalysis was reassuring as there was no infection or suspicion of kidney stone.  Beta was 71,821 and ultrasound shows a single IUP at 6 weeks 3 days without any subchorionic hemorrhage.  Patient was reassured.  She is encouraged to call her OB/GYN to make an  appointment and start prenatal vitamins.  She is encouraged to take Tylenol sparingly and use ice or heat to her back as needed for discomfort. ? ? ? ?FINAL CLINICAL IMPRESSION(S) / ED DIAGNOSES  ? ?Final diagnoses:  ?Strain of lumbar region, initial encounter  ?Less than [redacted] weeks gestation of pregnancy  ? ? ? ?Rx / DC Orders  ? ?ED Discharge Orders   ? ? None  ? ?  ? ? ? ?Note:  This document was prepared using Dragon voice recognition software and may include unintentional dictation errors. ?  ?Tommi Rumps, PA-C ?01/20/22 1516 ? ?  ?Minna Antis, MD ?01/21/22 1500 ? ?

## 2022-01-20 NOTE — Discharge Instructions (Signed)
Call make an appointment with your OB/GYN.  For your back you can take Tylenol sparingly and use ice packs or heat to your back as needed for discomfort.  Do not take any anti-inflammatories such as ibuprofen, Advil, Aleve or aspirin. ?

## 2022-02-09 DIAGNOSIS — O0993 Supervision of high risk pregnancy, unspecified, third trimester: Secondary | ICD-10-CM | POA: Insufficient documentation

## 2022-02-10 LAB — OB RESULTS CONSOLE RUBELLA ANTIBODY, IGM: Rubella: IMMUNE

## 2022-02-10 LAB — OB RESULTS CONSOLE VARICELLA ZOSTER ANTIBODY, IGG: Varicella: IMMUNE

## 2022-02-10 LAB — OB RESULTS CONSOLE HEPATITIS B SURFACE ANTIGEN: Hepatitis B Surface Ag: NEGATIVE

## 2022-02-18 ENCOUNTER — Other Ambulatory Visit: Payer: Self-pay | Admitting: Certified Nurse Midwife

## 2022-02-18 DIAGNOSIS — O0992 Supervision of high risk pregnancy, unspecified, second trimester: Secondary | ICD-10-CM

## 2022-02-18 DIAGNOSIS — Z8759 Personal history of other complications of pregnancy, childbirth and the puerperium: Secondary | ICD-10-CM

## 2022-03-22 ENCOUNTER — Ambulatory Visit: Payer: Medicaid Other

## 2022-04-19 ENCOUNTER — Ambulatory Visit (HOSPITAL_BASED_OUTPATIENT_CLINIC_OR_DEPARTMENT_OTHER): Payer: Medicaid Other | Admitting: Maternal & Fetal Medicine

## 2022-04-19 ENCOUNTER — Other Ambulatory Visit: Payer: Self-pay

## 2022-04-19 ENCOUNTER — Ambulatory Visit: Payer: Medicaid Other | Attending: Maternal & Fetal Medicine

## 2022-04-19 DIAGNOSIS — Z3A19 19 weeks gestation of pregnancy: Secondary | ICD-10-CM | POA: Diagnosis not present

## 2022-04-19 DIAGNOSIS — O09292 Supervision of pregnancy with other poor reproductive or obstetric history, second trimester: Secondary | ICD-10-CM | POA: Diagnosis not present

## 2022-04-19 DIAGNOSIS — O0992 Supervision of high risk pregnancy, unspecified, second trimester: Secondary | ICD-10-CM

## 2022-04-19 DIAGNOSIS — F17200 Nicotine dependence, unspecified, uncomplicated: Secondary | ICD-10-CM

## 2022-04-19 DIAGNOSIS — Z8759 Personal history of other complications of pregnancy, childbirth and the puerperium: Secondary | ICD-10-CM

## 2022-04-19 DIAGNOSIS — O4442 Low lying placenta NOS or without hemorrhage, second trimester: Secondary | ICD-10-CM

## 2022-04-19 DIAGNOSIS — Z363 Encounter for antenatal screening for malformations: Secondary | ICD-10-CM | POA: Insufficient documentation

## 2022-04-19 DIAGNOSIS — Z6841 Body Mass Index (BMI) 40.0 and over, adult: Secondary | ICD-10-CM

## 2022-04-19 DIAGNOSIS — O34219 Maternal care for unspecified type scar from previous cesarean delivery: Secondary | ICD-10-CM | POA: Diagnosis present

## 2022-04-19 DIAGNOSIS — O34218 Maternal care for other type scar from previous cesarean delivery: Secondary | ICD-10-CM | POA: Diagnosis not present

## 2022-04-19 DIAGNOSIS — F1290 Cannabis use, unspecified, uncomplicated: Secondary | ICD-10-CM

## 2022-04-19 DIAGNOSIS — O4440 Low lying placenta NOS or without hemorrhage, unspecified trimester: Secondary | ICD-10-CM

## 2022-04-19 DIAGNOSIS — O99212 Obesity complicating pregnancy, second trimester: Secondary | ICD-10-CM

## 2022-04-19 DIAGNOSIS — O09522 Supervision of elderly multigravida, second trimester: Secondary | ICD-10-CM

## 2022-05-17 ENCOUNTER — Ambulatory Visit: Payer: Medicaid Other

## 2022-05-24 ENCOUNTER — Other Ambulatory Visit: Payer: Self-pay

## 2022-05-24 ENCOUNTER — Ambulatory Visit: Payer: Medicaid Other | Attending: Obstetrics and Gynecology

## 2022-05-24 DIAGNOSIS — O99212 Obesity complicating pregnancy, second trimester: Secondary | ICD-10-CM | POA: Diagnosis not present

## 2022-05-24 DIAGNOSIS — E669 Obesity, unspecified: Secondary | ICD-10-CM | POA: Insufficient documentation

## 2022-05-24 DIAGNOSIS — O4442 Low lying placenta NOS or without hemorrhage, second trimester: Secondary | ICD-10-CM

## 2022-05-24 DIAGNOSIS — O09292 Supervision of pregnancy with other poor reproductive or obstetric history, second trimester: Secondary | ICD-10-CM | POA: Diagnosis not present

## 2022-05-24 DIAGNOSIS — O09522 Supervision of elderly multigravida, second trimester: Secondary | ICD-10-CM | POA: Insufficient documentation

## 2022-05-24 DIAGNOSIS — O4440 Low lying placenta NOS or without hemorrhage, unspecified trimester: Secondary | ICD-10-CM | POA: Diagnosis not present

## 2022-05-24 DIAGNOSIS — F17200 Nicotine dependence, unspecified, uncomplicated: Secondary | ICD-10-CM

## 2022-05-24 DIAGNOSIS — Z3A24 24 weeks gestation of pregnancy: Secondary | ICD-10-CM | POA: Insufficient documentation

## 2022-05-24 DIAGNOSIS — F1290 Cannabis use, unspecified, uncomplicated: Secondary | ICD-10-CM

## 2022-07-07 ENCOUNTER — Other Ambulatory Visit: Payer: Self-pay

## 2022-07-07 DIAGNOSIS — O4440 Low lying placenta NOS or without hemorrhage, unspecified trimester: Secondary | ICD-10-CM

## 2022-07-07 DIAGNOSIS — J45909 Unspecified asthma, uncomplicated: Secondary | ICD-10-CM

## 2022-07-07 DIAGNOSIS — Z8759 Personal history of other complications of pregnancy, childbirth and the puerperium: Secondary | ICD-10-CM

## 2022-07-12 ENCOUNTER — Other Ambulatory Visit: Payer: Self-pay

## 2022-07-12 ENCOUNTER — Ambulatory Visit: Payer: Medicaid Other | Attending: Maternal & Fetal Medicine

## 2022-07-12 DIAGNOSIS — O4443 Low lying placenta NOS or without hemorrhage, third trimester: Secondary | ICD-10-CM | POA: Insufficient documentation

## 2022-07-12 DIAGNOSIS — Z3A31 31 weeks gestation of pregnancy: Secondary | ICD-10-CM | POA: Insufficient documentation

## 2022-07-12 DIAGNOSIS — O09293 Supervision of pregnancy with other poor reproductive or obstetric history, third trimester: Secondary | ICD-10-CM | POA: Diagnosis not present

## 2022-07-12 DIAGNOSIS — O99213 Obesity complicating pregnancy, third trimester: Secondary | ICD-10-CM | POA: Insufficient documentation

## 2022-07-12 DIAGNOSIS — J45909 Unspecified asthma, uncomplicated: Secondary | ICD-10-CM | POA: Diagnosis not present

## 2022-07-12 DIAGNOSIS — O09523 Supervision of elderly multigravida, third trimester: Secondary | ICD-10-CM | POA: Insufficient documentation

## 2022-07-12 DIAGNOSIS — Z8759 Personal history of other complications of pregnancy, childbirth and the puerperium: Secondary | ICD-10-CM

## 2022-07-12 DIAGNOSIS — O99513 Diseases of the respiratory system complicating pregnancy, third trimester: Secondary | ICD-10-CM | POA: Diagnosis not present

## 2022-07-12 DIAGNOSIS — O4440 Low lying placenta NOS or without hemorrhage, unspecified trimester: Secondary | ICD-10-CM

## 2022-08-02 ENCOUNTER — Other Ambulatory Visit: Payer: Self-pay

## 2022-08-02 DIAGNOSIS — Z8759 Personal history of other complications of pregnancy, childbirth and the puerperium: Secondary | ICD-10-CM

## 2022-08-02 DIAGNOSIS — O99213 Obesity complicating pregnancy, third trimester: Secondary | ICD-10-CM

## 2022-08-04 ENCOUNTER — Other Ambulatory Visit: Payer: Self-pay | Admitting: Obstetrics

## 2022-08-04 ENCOUNTER — Ambulatory Visit: Payer: Medicaid Other | Attending: Obstetrics

## 2022-08-04 ENCOUNTER — Other Ambulatory Visit: Payer: Self-pay

## 2022-08-04 DIAGNOSIS — O99213 Obesity complicating pregnancy, third trimester: Secondary | ICD-10-CM

## 2022-08-04 DIAGNOSIS — Z3A34 34 weeks gestation of pregnancy: Secondary | ICD-10-CM | POA: Insufficient documentation

## 2022-08-04 DIAGNOSIS — O4440 Low lying placenta NOS or without hemorrhage, unspecified trimester: Secondary | ICD-10-CM

## 2022-08-04 DIAGNOSIS — Z8759 Personal history of other complications of pregnancy, childbirth and the puerperium: Secondary | ICD-10-CM

## 2022-08-04 DIAGNOSIS — O09523 Supervision of elderly multigravida, third trimester: Secondary | ICD-10-CM | POA: Diagnosis not present

## 2022-08-04 DIAGNOSIS — E669 Obesity, unspecified: Secondary | ICD-10-CM | POA: Diagnosis not present

## 2022-08-04 DIAGNOSIS — O09293 Supervision of pregnancy with other poor reproductive or obstetric history, third trimester: Secondary | ICD-10-CM | POA: Insufficient documentation

## 2022-08-08 ENCOUNTER — Observation Stay
Admission: EM | Admit: 2022-08-08 | Discharge: 2022-08-08 | Disposition: A | Discharge status: Home / Self Care | Payer: Medicaid Other | Attending: Obstetrics | Admitting: Obstetrics

## 2022-08-08 ENCOUNTER — Other Ambulatory Visit: Payer: Self-pay

## 2022-08-08 DIAGNOSIS — Z79899 Other long term (current) drug therapy: Secondary | ICD-10-CM | POA: Diagnosis not present

## 2022-08-08 DIAGNOSIS — O99332 Smoking (tobacco) complicating pregnancy, second trimester: Secondary | ICD-10-CM | POA: Diagnosis not present

## 2022-08-08 DIAGNOSIS — O368130 Decreased fetal movements, third trimester, not applicable or unspecified: Principal | ICD-10-CM | POA: Insufficient documentation

## 2022-08-08 DIAGNOSIS — O133 Gestational [pregnancy-induced] hypertension without significant proteinuria, third trimester: Secondary | ICD-10-CM | POA: Diagnosis not present

## 2022-08-08 DIAGNOSIS — F17210 Nicotine dependence, cigarettes, uncomplicated: Secondary | ICD-10-CM | POA: Diagnosis not present

## 2022-08-08 DIAGNOSIS — O99513 Diseases of the respiratory system complicating pregnancy, third trimester: Secondary | ICD-10-CM | POA: Diagnosis not present

## 2022-08-08 DIAGNOSIS — M79641 Pain in right hand: Secondary | ICD-10-CM | POA: Diagnosis present

## 2022-08-08 DIAGNOSIS — Z7982 Long term (current) use of aspirin: Secondary | ICD-10-CM | POA: Diagnosis not present

## 2022-08-08 DIAGNOSIS — J45909 Unspecified asthma, uncomplicated: Secondary | ICD-10-CM | POA: Diagnosis not present

## 2022-08-08 LAB — URINALYSIS, ROUTINE W REFLEX MICROSCOPIC
Bilirubin Urine: NEGATIVE
Glucose, UA: NEGATIVE mg/dL
Hgb urine dipstick: NEGATIVE
Ketones, ur: NEGATIVE mg/dL
Leukocytes,Ua: NEGATIVE
Nitrite: NEGATIVE
Protein, ur: NEGATIVE mg/dL
Specific Gravity, Urine: 1.016 (ref 1.005–1.030)
pH: 6 (ref 5.0–8.0)

## 2022-08-08 MED ORDER — ACETAMINOPHEN 500 MG PO TABS
1000.0000 mg | ORAL_TABLET | Freq: Four times a day (QID) | ORAL | Status: DC | PRN
  Administered 2022-08-08: 1000 mg via ORAL
  Filled 2022-08-08: qty 2

## 2022-08-08 MED ORDER — ACETAMINOPHEN 500 MG PO TABS: 1000.0000 mg | ORAL_TABLET | Freq: Four times a day (QID) | ORAL | 0 refills | Status: DC | PRN

## 2022-08-08 NOTE — Discharge Summary (Signed)
Kelly Stafford is a 36 y.o. female. She is at [redacted]w[redacted]d gestation. Patient's last menstrual period was 11/28/2021 (exact date). Estimated Date of Delivery: 09/12/22  Prenatal care site: [facility]  Chief complaint: Pt presents to triage with c/o b/l hand pain that radiates to upper arm.  Pt also reports back pain, increased pelvic pressure, and decreased fetal movement - states "baby is not moving as much as he normally does but is kicking sometimes".    HPI: Kelly Stafford presents to L&D with complaints of bilateral hand pain that radiates to upper arm, back pain and decreased FM  Factors complicating pregnancy: Low lying placenta H/o pre eclampsia Obesity Tobacco use Previous C-Section AMA Abnormal 1 hr gtt, normal 3 hr gtt [facility]- resolved  S: Resting comfortably. no CTX, no VB.no LOF,  Active fetal movement.   Maternal Medical History:  Past Medical Hx:  has a past medical history of Abnormal Pap smear of cervix 04/02/20 neg HPV+ (04/06/2020), Asthma, Gastric ulcer, History of C-section, History of hypertension, and Hypertension.    Past Surgical Hx:  has a past surgical history that includes Tonsillectomy; Cesarean section; Adenoidectomy; and Incision and drainage.   No Known Allergies   Prior to Admission medications   Medication Sig Start Date End Date Taking? Authorizing Provider  aspirin 81 MG chewable tablet Chew 81 mg by mouth daily.   Yes [provider]  folic acid (FOLVITE) 1 MG tablet Take 1 mg by mouth daily.   Yes [provider]  Prenatal Vit-Fe Fumarate-FA (PRENATAL MULTIVITAMIN) TABS tablet Take 1 tablet by mouth daily at 12 noon.   Yes [provider]  acetaminophen (TYLENOL) 500 MG tablet Take 2 tablets (1,000 mg total) by mouth every 6 (six) hours as needed (for pain scale < 4  OR  temperature  >/=  100.5 F). 41/66/06   Ed Blalock, CNM    Social History: She  reports that she has been smoking cigarettes. She  has a 10.00 pack-year smoking history. She has never used smokeless tobacco. She reports that she does not currently use alcohol. She reports current drug use. Drugs: Marijuana and Methamphetamines.  Family History: family history includes Diabetes in her maternal grandmother and paternal grandmother; Hypertension in her father; Stroke in her father. ,no history of gyn cancers  Review of Systems: A full review of systems was performed and negative except as noted in the HPI.    O:  BP 125/83 (BP Location: Left Arm)   Pulse 95   Temp 98.5 F (36.9 C) (Oral)   Resp 18   Ht 5\' 4"  (1.626 m)   Wt 126.1 kg   LMP 11/28/2021 (Exact Date)   BMI 47.72 kg/m  Results for orders placed or performed during the hospital encounter of 08/08/22 (from the past 48 hour(s))  Urinalysis, Routine w reflex microscopic Urine, Clean Catch   Collection Time: 08/08/22  7:37 PM  Result Value Ref Range   Color, Urine YELLOW (A) YELLOW   APPearance HAZY (A) CLEAR   Specific Gravity, Urine 1.016 1.005 - 1.030   pH 6.0 5.0 - 8.0   Glucose, UA NEGATIVE NEGATIVE mg/dL   Hgb urine dipstick NEGATIVE NEGATIVE   Bilirubin Urine NEGATIVE NEGATIVE   Ketones, ur NEGATIVE NEGATIVE mg/dL   Protein, ur NEGATIVE NEGATIVE mg/dL   Nitrite NEGATIVE NEGATIVE   Leukocytes,Ua NEGATIVE NEGATIVE     Constitutional: NAD, AAOx3  HE/ENT: extraocular movements grossly intact, moist mucous membranes CV: RRR PULM: nl respiratory  effort, CTABL Abd: gravid, non-tender, non-distended, soft  Ext: Non-tender, Nonedmeatous Psych: mood appropriate, speech normal Pelvic : deferred SVE:     Fetal Monitor: Baseline: 135 bpm Variability: moderate Accels: Present Decels: none Toco: none  Category: I   Assessment: 36 y.o. [redacted]w[redacted]d here for antenatal surveillance during pregnancy.  Principle diagnosis:  There were no encounter diagnoses.   Plan: Labor: not present.  Fetal Wellbeing: Reassuring Cat 1 tracing. Reactive NST  Hand  splints Patient to take Tylenol 1000 mg every 6 hours PRN UA- neg D/c home stable, precautions reviewed, follow-up as scheduled.   ----- Chari Manning, CNM Certified Nurse Midwife [facility]

## 2022-08-08 NOTE — OB Triage Note (Signed)
Pt presents to triage with c/o b/l hand pain that radiates to upper arm.  Pt also reports back pain, increased pelvic pressure, and decreased fetal movement - states "baby is not moving as much as he normally does but is kicking sometimes".  Denies HA, blurry vision, VB, and LOF. Reports pain 8/10.

## 2022-08-09 ENCOUNTER — Other Ambulatory Visit: Payer: Self-pay

## 2022-08-09 DIAGNOSIS — F1290 Cannabis use, unspecified, uncomplicated: Secondary | ICD-10-CM

## 2022-08-09 DIAGNOSIS — O99213 Obesity complicating pregnancy, third trimester: Secondary | ICD-10-CM

## 2022-08-09 DIAGNOSIS — O4440 Low lying placenta NOS or without hemorrhage, unspecified trimester: Secondary | ICD-10-CM

## 2022-08-11 ENCOUNTER — Other Ambulatory Visit: Payer: Self-pay

## 2022-08-11 ENCOUNTER — Ambulatory Visit: Payer: Medicaid Other | Attending: Obstetrics

## 2022-08-11 DIAGNOSIS — O1503 Eclampsia in pregnancy, third trimester: Secondary | ICD-10-CM | POA: Insufficient documentation

## 2022-08-11 DIAGNOSIS — O09523 Supervision of elderly multigravida, third trimester: Secondary | ICD-10-CM | POA: Insufficient documentation

## 2022-08-11 DIAGNOSIS — O09293 Supervision of pregnancy with other poor reproductive or obstetric history, third trimester: Secondary | ICD-10-CM | POA: Diagnosis not present

## 2022-08-11 DIAGNOSIS — O4443 Low lying placenta NOS or without hemorrhage, third trimester: Secondary | ICD-10-CM | POA: Diagnosis not present

## 2022-08-11 DIAGNOSIS — O1493 Unspecified pre-eclampsia, third trimester: Secondary | ICD-10-CM | POA: Diagnosis not present

## 2022-08-11 DIAGNOSIS — O99213 Obesity complicating pregnancy, third trimester: Secondary | ICD-10-CM | POA: Insufficient documentation

## 2022-08-11 DIAGNOSIS — E669 Obesity, unspecified: Secondary | ICD-10-CM | POA: Insufficient documentation

## 2022-08-11 DIAGNOSIS — O4440 Low lying placenta NOS or without hemorrhage, unspecified trimester: Secondary | ICD-10-CM

## 2022-08-11 DIAGNOSIS — Z3A35 35 weeks gestation of pregnancy: Secondary | ICD-10-CM | POA: Insufficient documentation

## 2022-08-11 DIAGNOSIS — O133 Gestational [pregnancy-induced] hypertension without significant proteinuria, third trimester: Secondary | ICD-10-CM | POA: Diagnosis not present

## 2022-08-16 ENCOUNTER — Other Ambulatory Visit: Payer: Self-pay

## 2022-08-16 DIAGNOSIS — O4440 Low lying placenta NOS or without hemorrhage, unspecified trimester: Secondary | ICD-10-CM

## 2022-08-16 DIAGNOSIS — O09299 Supervision of pregnancy with other poor reproductive or obstetric history, unspecified trimester: Secondary | ICD-10-CM

## 2022-08-16 DIAGNOSIS — O99213 Obesity complicating pregnancy, third trimester: Secondary | ICD-10-CM

## 2022-08-16 DIAGNOSIS — O99210 Obesity complicating pregnancy, unspecified trimester: Secondary | ICD-10-CM

## 2022-08-16 LAB — OB RESULTS CONSOLE RPR: RPR: NONREACTIVE

## 2022-08-16 LAB — OB RESULTS CONSOLE HIV ANTIBODY (ROUTINE TESTING): HIV: NONREACTIVE

## 2022-08-16 LAB — OB RESULTS CONSOLE GBS: GBS: NEGATIVE

## 2022-08-16 LAB — OB RESULTS CONSOLE GC/CHLAMYDIA
Chlamydia: NEGATIVE
Neisseria Gonorrhea: NEGATIVE

## 2022-08-18 ENCOUNTER — Ambulatory Visit: Payer: Medicaid Other | Attending: Obstetrics and Gynecology

## 2022-08-18 DIAGNOSIS — O09523 Supervision of elderly multigravida, third trimester: Secondary | ICD-10-CM | POA: Diagnosis present

## 2022-08-18 DIAGNOSIS — O09299 Supervision of pregnancy with other poor reproductive or obstetric history, unspecified trimester: Secondary | ICD-10-CM

## 2022-08-18 DIAGNOSIS — O99213 Obesity complicating pregnancy, third trimester: Secondary | ICD-10-CM

## 2022-08-18 DIAGNOSIS — O1493 Unspecified pre-eclampsia, third trimester: Secondary | ICD-10-CM | POA: Insufficient documentation

## 2022-08-18 DIAGNOSIS — Z3A36 36 weeks gestation of pregnancy: Secondary | ICD-10-CM

## 2022-08-18 DIAGNOSIS — O09293 Supervision of pregnancy with other poor reproductive or obstetric history, third trimester: Secondary | ICD-10-CM | POA: Diagnosis present

## 2022-08-18 DIAGNOSIS — O4443 Low lying placenta NOS or without hemorrhage, third trimester: Secondary | ICD-10-CM | POA: Diagnosis not present

## 2022-08-18 DIAGNOSIS — O99210 Obesity complicating pregnancy, unspecified trimester: Secondary | ICD-10-CM

## 2022-08-18 DIAGNOSIS — O99214 Obesity complicating childbirth: Secondary | ICD-10-CM | POA: Diagnosis not present

## 2022-08-18 DIAGNOSIS — O4440 Low lying placenta NOS or without hemorrhage, unspecified trimester: Secondary | ICD-10-CM

## 2022-08-18 DIAGNOSIS — E669 Obesity, unspecified: Secondary | ICD-10-CM

## 2022-08-23 ENCOUNTER — Other Ambulatory Visit: Payer: Self-pay

## 2022-08-23 DIAGNOSIS — F17200 Nicotine dependence, unspecified, uncomplicated: Secondary | ICD-10-CM

## 2022-08-23 DIAGNOSIS — O99210 Obesity complicating pregnancy, unspecified trimester: Secondary | ICD-10-CM

## 2022-08-23 DIAGNOSIS — O09523 Supervision of elderly multigravida, third trimester: Secondary | ICD-10-CM

## 2022-08-25 ENCOUNTER — Inpatient Hospital Stay: Payer: Medicaid Other | Admitting: Anesthesiology

## 2022-08-25 ENCOUNTER — Inpatient Hospital Stay
Admission: RE | Admit: 2022-08-25 | Discharge: 2022-08-28 | DRG: 784 | Disposition: A | Discharge status: Home / Self Care | Payer: Medicaid Other | Source: Ambulatory Visit

## 2022-08-25 ENCOUNTER — Encounter: Payer: Self-pay | Admitting: Obstetrics and Gynecology

## 2022-08-25 ENCOUNTER — Ambulatory Visit (HOSPITAL_BASED_OUTPATIENT_CLINIC_OR_DEPARTMENT_OTHER): Payer: Medicaid Other

## 2022-08-25 ENCOUNTER — Encounter
Admission: RE | Disposition: A | Discharge status: Home / Self Care | Payer: Self-pay | Source: Ambulatory Visit | Attending: Obstetrics and Gynecology

## 2022-08-25 ENCOUNTER — Other Ambulatory Visit: Payer: Self-pay

## 2022-08-25 DIAGNOSIS — O34211 Maternal care for low transverse scar from previous cesarean delivery: Secondary | ICD-10-CM | POA: Diagnosis present

## 2022-08-25 DIAGNOSIS — O99214 Obesity complicating childbirth: Secondary | ICD-10-CM | POA: Diagnosis present

## 2022-08-25 DIAGNOSIS — O09293 Supervision of pregnancy with other poor reproductive or obstetric history, third trimester: Secondary | ICD-10-CM

## 2022-08-25 DIAGNOSIS — E669 Obesity, unspecified: Secondary | ICD-10-CM | POA: Diagnosis not present

## 2022-08-25 DIAGNOSIS — O133 Gestational [pregnancy-induced] hypertension without significant proteinuria, third trimester: Principal | ICD-10-CM | POA: Diagnosis present

## 2022-08-25 DIAGNOSIS — F17200 Nicotine dependence, unspecified, uncomplicated: Secondary | ICD-10-CM

## 2022-08-25 DIAGNOSIS — D62 Acute posthemorrhagic anemia: Secondary | ICD-10-CM | POA: Diagnosis not present

## 2022-08-25 DIAGNOSIS — Z302 Encounter for sterilization: Secondary | ICD-10-CM | POA: Diagnosis not present

## 2022-08-25 DIAGNOSIS — O9081 Anemia of the puerperium: Secondary | ICD-10-CM | POA: Diagnosis not present

## 2022-08-25 DIAGNOSIS — Z3A37 37 weeks gestation of pregnancy: Secondary | ICD-10-CM | POA: Insufficient documentation

## 2022-08-25 DIAGNOSIS — O134 Gestational [pregnancy-induced] hypertension without significant proteinuria, complicating childbirth: Secondary | ICD-10-CM | POA: Diagnosis present

## 2022-08-25 DIAGNOSIS — O99210 Obesity complicating pregnancy, unspecified trimester: Secondary | ICD-10-CM

## 2022-08-25 DIAGNOSIS — O4103X0 Oligohydramnios, third trimester, not applicable or unspecified: Secondary | ICD-10-CM | POA: Diagnosis present

## 2022-08-25 DIAGNOSIS — O99334 Smoking (tobacco) complicating childbirth: Secondary | ICD-10-CM | POA: Diagnosis present

## 2022-08-25 DIAGNOSIS — R030 Elevated blood-pressure reading, without diagnosis of hypertension: Secondary | ICD-10-CM | POA: Diagnosis present

## 2022-08-25 DIAGNOSIS — F17210 Nicotine dependence, cigarettes, uncomplicated: Secondary | ICD-10-CM | POA: Diagnosis present

## 2022-08-25 DIAGNOSIS — O09523 Supervision of elderly multigravida, third trimester: Secondary | ICD-10-CM | POA: Insufficient documentation

## 2022-08-25 DIAGNOSIS — Z20822 Contact with and (suspected) exposure to covid-19: Secondary | ICD-10-CM | POA: Diagnosis present

## 2022-08-25 DIAGNOSIS — O99213 Obesity complicating pregnancy, third trimester: Secondary | ICD-10-CM | POA: Insufficient documentation

## 2022-08-25 DIAGNOSIS — O1205 Gestational edema, complicating the puerperium: Secondary | ICD-10-CM | POA: Diagnosis present

## 2022-08-25 DIAGNOSIS — O4443 Low lying placenta NOS or without hemorrhage, third trimester: Secondary | ICD-10-CM

## 2022-08-25 LAB — PROTEIN / CREATININE RATIO, URINE
Creatinine, Urine: 204 mg/dL
Protein Creatinine Ratio: 0.13 mg/mg{Cre} (ref 0.00–0.15)
Total Protein, Urine: 27 mg/dL

## 2022-08-25 LAB — URINE DRUG SCREEN, QUALITATIVE (ARMC ONLY)
Amphetamines, Ur Screen: NOT DETECTED
Barbiturates, Ur Screen: NOT DETECTED
Benzodiazepine, Ur Scrn: NOT DETECTED
Cannabinoid 50 Ng, Ur ~~LOC~~: POSITIVE — AB
Cocaine Metabolite,Ur ~~LOC~~: NOT DETECTED
MDMA (Ecstasy)Ur Screen: NOT DETECTED
Methadone Scn, Ur: NOT DETECTED
Opiate, Ur Screen: NOT DETECTED
Phencyclidine (PCP) Ur S: NOT DETECTED
Tricyclic, Ur Screen: NOT DETECTED

## 2022-08-25 LAB — COMPREHENSIVE METABOLIC PANEL
ALT: 9 U/L (ref 0–44)
AST: 14 U/L — ABNORMAL LOW (ref 15–41)
Albumin: 2.4 g/dL — ABNORMAL LOW (ref 3.5–5.0)
Alkaline Phosphatase: 134 U/L — ABNORMAL HIGH (ref 38–126)
Anion gap: 7 (ref 5–15)
BUN: 11 mg/dL (ref 6–20)
CO2: 22 mmol/L (ref 22–32)
Calcium: 8.2 mg/dL — ABNORMAL LOW (ref 8.9–10.3)
Chloride: 107 mmol/L (ref 98–111)
Creatinine, Ser: 0.58 mg/dL (ref 0.44–1.00)
GFR, Estimated: >60 mL/min (ref >60)
Glucose, Bld: 88 mg/dL (ref 70–99)
Potassium: 3.8 mmol/L (ref 3.5–5.1)
Sodium: 136 mmol/L (ref 135–145)
Total Bilirubin: 0.4 mg/dL (ref 0.3–1.2)
Total Protein: 6.3 g/dL — ABNORMAL LOW (ref 6.5–8.1)

## 2022-08-25 LAB — CBC
HCT: 33.8 % — ABNORMAL LOW (ref 36.0–46.0)
Hemoglobin: 11.2 g/dL — ABNORMAL LOW (ref 12.0–15.0)
MCH: 28.9 pg (ref 26.0–34.0)
MCHC: 33.1 g/dL (ref 30.0–36.0)
MCV: 87.3 fL (ref 80.0–100.0)
Platelets: 332 10*3/uL (ref 150–400)
RBC: 3.87 MIL/uL (ref 3.87–5.11)
RDW: 14.6 % (ref 11.5–15.5)
WBC: 13.4 10*3/uL — ABNORMAL HIGH (ref 4.0–10.5)
nRBC: 0 % (ref 0.0–0.2)

## 2022-08-25 LAB — TYPE AND SCREEN
ABO/RH(D): A POS
Antibody Screen: NEGATIVE

## 2022-08-25 LAB — RESP PANEL BY RT-PCR (FLU A&B, COVID) ARPGX2
Influenza A by PCR: NEGATIVE
Influenza B by PCR: NEGATIVE
SARS Coronavirus 2 by RT PCR: NEGATIVE

## 2022-08-25 SURGERY — Surgical Case
Anesthesia: Spinal | Laterality: Bilateral

## 2022-08-25 MED ORDER — IBUPROFEN 600 MG PO TABS
600.0000 mg | ORAL_TABLET | Freq: Four times a day (QID) | ORAL | Status: DC
  Administered 2022-08-27 – 2022-08-28 (×6): 600 mg via ORAL
  Filled 2022-08-25 (×6): qty 1

## 2022-08-25 MED ORDER — OXYTOCIN-SODIUM CHLORIDE 30-0.9 UT/500ML-% IV SOLN
2.5000 [IU]/h | INTRAVENOUS | Status: DC
  Administered 2022-08-25: 2.5 [IU]/h via INTRAVENOUS
  Filled 2022-08-25: qty 500

## 2022-08-25 MED ORDER — ONDANSETRON HCL 4 MG/2ML IJ SOLN: 4.0000 mg | Freq: Three times a day (TID) | INTRAMUSCULAR | Status: DC | PRN

## 2022-08-25 MED ORDER — SOD CITRATE-CITRIC ACID 500-334 MG/5ML PO SOLN: 30.0000 mL | ORAL | Status: DC | PRN

## 2022-08-25 MED ORDER — BUPIVACAINE HCL (PF) 0.5 % IJ SOLN
60.0000 mL | Freq: Once | INTRAMUSCULAR | Status: DC
  Filled 2022-08-25 (×2): qty 60

## 2022-08-25 MED ORDER — TETANUS-DIPHTH-ACELL PERTUSSIS 5-2.5-18.5 LF-MCG/0.5 IM SUSY: 0.5000 mL | PREFILLED_SYRINGE | Freq: Once | INTRAMUSCULAR | Status: DC

## 2022-08-25 MED ORDER — FENTANYL CITRATE (PF) 100 MCG/2ML IJ SOLN: 25.0000 ug | INTRAMUSCULAR | Status: DC | PRN

## 2022-08-25 MED ORDER — DEXMEDETOMIDINE HCL IN NACL 80 MCG/20ML IV SOLN
INTRAVENOUS | Status: DC | PRN
  Administered 2022-08-25: 4 ug via BUCCAL

## 2022-08-25 MED ORDER — KETOROLAC TROMETHAMINE 30 MG/ML IJ SOLN
30.0000 mg | Freq: Four times a day (QID) | INTRAMUSCULAR | Status: AC
  Administered 2022-08-26 (×3): 30 mg via INTRAVENOUS
  Filled 2022-08-25 (×3): qty 1

## 2022-08-25 MED ORDER — MEPERIDINE HCL 25 MG/ML IJ SOLN: 6.2500 mg | INTRAMUSCULAR | Status: DC | PRN

## 2022-08-25 MED ORDER — DIBUCAINE (PERIANAL) 1 % EX OINT: 1.0000 | TOPICAL_OINTMENT | CUTANEOUS | Status: DC | PRN

## 2022-08-25 MED ORDER — OXYCODONE HCL 5 MG PO TABS: 5.0000 mg | ORAL_TABLET | ORAL | Status: AC | PRN

## 2022-08-25 MED ORDER — PHENYLEPHRINE HCL-NACL 20-0.9 MG/250ML-% IV SOLN
INTRAVENOUS | Status: AC
  Filled 2022-08-25: qty 250

## 2022-08-25 MED ORDER — PRENATAL MULTIVITAMIN CH
1.0000 | ORAL_TABLET | Freq: Every day | ORAL | Status: DC
  Administered 2022-08-26 – 2022-08-27 (×2): 1 via ORAL
  Filled 2022-08-25 (×2): qty 1

## 2022-08-25 MED ORDER — FENTANYL CITRATE (PF) 100 MCG/2ML IJ SOLN
INTRAMUSCULAR | Status: DC | PRN
  Administered 2022-08-25: 15 ug via INTRATHECAL

## 2022-08-25 MED ORDER — SIMETHICONE 80 MG PO CHEW: 80.0000 mg | CHEWABLE_TABLET | ORAL | Status: DC | PRN

## 2022-08-25 MED ORDER — SODIUM CHLORIDE 0.9% FLUSH: 20.0000 mL | Freq: Once | INTRAVENOUS | Status: DC

## 2022-08-25 MED ORDER — OXYCODONE HCL 5 MG/5ML PO SOLN: 5.0000 mg | Freq: Once | ORAL | Status: DC | PRN

## 2022-08-25 MED ORDER — SOD CITRATE-CITRIC ACID 500-334 MG/5ML PO SOLN: 30.0000 mL | ORAL | Status: DC

## 2022-08-25 MED ORDER — LACTATED RINGERS IV SOLN: 500.0000 mL | INTRAVENOUS | Status: DC | PRN

## 2022-08-25 MED ORDER — DIPHENHYDRAMINE HCL 25 MG PO CAPS: 25.0000 mg | ORAL_CAPSULE | Freq: Four times a day (QID) | ORAL | Status: DC | PRN

## 2022-08-25 MED ORDER — KETOROLAC TROMETHAMINE 30 MG/ML IJ SOLN: 30.0000 mg | Freq: Four times a day (QID) | INTRAMUSCULAR | Status: DC

## 2022-08-25 MED ORDER — OXYTOCIN BOLUS FROM INFUSION: 333.0000 mL | Freq: Once | INTRAVENOUS | Status: DC

## 2022-08-25 MED ORDER — ENOXAPARIN SODIUM 40 MG/0.4ML IJ SOSY
40.0000 mg | PREFILLED_SYRINGE | INTRAMUSCULAR | Status: DC
  Administered 2022-08-26 – 2022-08-27 (×2): 40 mg via SUBCUTANEOUS
  Filled 2022-08-25 (×2): qty 0.4

## 2022-08-25 MED ORDER — CEFAZOLIN IN SODIUM CHLORIDE 3-0.9 GM/100ML-% IV SOLN
3.0000 g | INTRAVENOUS | Status: AC
  Administered 2022-08-25: 3 g via INTRAVENOUS
  Filled 2022-08-25: qty 100

## 2022-08-25 MED ORDER — ONDANSETRON HCL 4 MG/2ML IJ SOLN: 4.0000 mg | Freq: Four times a day (QID) | INTRAMUSCULAR | Status: DC | PRN

## 2022-08-25 MED ORDER — KETOROLAC TROMETHAMINE 30 MG/ML IJ SOLN
30.0000 mg | Freq: Four times a day (QID) | INTRAMUSCULAR | Status: DC
  Administered 2022-08-25: 30 mg via INTRAVENOUS
  Filled 2022-08-25: qty 1

## 2022-08-25 MED ORDER — SIMETHICONE 80 MG PO CHEW
80.0000 mg | CHEWABLE_TABLET | Freq: Three times a day (TID) | ORAL | Status: DC
  Administered 2022-08-26 – 2022-08-28 (×7): 80 mg via ORAL
  Filled 2022-08-25 (×7): qty 1

## 2022-08-25 MED ORDER — MORPHINE SULFATE (PF) 0.5 MG/ML IJ SOLN
INTRAMUSCULAR | Status: AC
  Filled 2022-08-25: qty 10

## 2022-08-25 MED ORDER — MEASLES, MUMPS & RUBELLA VAC IJ SOLR
0.5000 mL | Freq: Once | INTRAMUSCULAR | Status: DC
  Filled 2022-08-25: qty 0.5

## 2022-08-25 MED ORDER — NALOXONE HCL 4 MG/10ML IJ SOLN: 1.0000 ug/kg/h | INTRAVENOUS | Status: DC | PRN

## 2022-08-25 MED ORDER — BISACODYL 10 MG RE SUPP: 10.0000 mg | Freq: Every day | RECTAL | Status: DC | PRN

## 2022-08-25 MED ORDER — FLEET ENEMA 7-19 GM/118ML RE ENEM: 1.0000 | ENEMA | Freq: Every day | RECTAL | Status: DC | PRN

## 2022-08-25 MED ORDER — ACETAMINOPHEN 325 MG PO TABS: 650.0000 mg | ORAL_TABLET | ORAL | Status: DC | PRN

## 2022-08-25 MED ORDER — GABAPENTIN 300 MG PO CAPS
300.0000 mg | ORAL_CAPSULE | Freq: Every day | ORAL | Status: DC
  Administered 2022-08-26 – 2022-08-27 (×3): 300 mg via ORAL
  Filled 2022-08-25 (×3): qty 1

## 2022-08-25 MED ORDER — LACTATED RINGERS IV SOLN: INTRAVENOUS | Status: DC

## 2022-08-25 MED ORDER — WITCH HAZEL-GLYCERIN EX PADS: 1.0000 | MEDICATED_PAD | CUTANEOUS | Status: DC | PRN

## 2022-08-25 MED ORDER — ONDANSETRON HCL 4 MG/2ML IJ SOLN
INTRAMUSCULAR | Status: DC | PRN
  Administered 2022-08-25: 4 mg via INTRAVENOUS

## 2022-08-25 MED ORDER — SODIUM CHLORIDE (PF) 0.9 % IJ SOLN
INTRAMUSCULAR | Status: AC
  Filled 2022-08-25: qty 50

## 2022-08-25 MED ORDER — PHENYLEPHRINE HCL (PRESSORS) 10 MG/ML IV SOLN
INTRAVENOUS | Status: DC | PRN
  Administered 2022-08-25 (×3): 80 ug via INTRAVENOUS

## 2022-08-25 MED ORDER — OXYTOCIN-SODIUM CHLORIDE 30-0.9 UT/500ML-% IV SOLN
INTRAVENOUS | Status: DC | PRN
  Administered 2022-08-25: 300 mL via INTRAVENOUS

## 2022-08-25 MED ORDER — OXYCODONE HCL 5 MG PO TABS: 5.0000 mg | ORAL_TABLET | Freq: Once | ORAL | Status: DC | PRN

## 2022-08-25 MED ORDER — DIPHENHYDRAMINE HCL 25 MG PO CAPS: 25.0000 mg | ORAL_CAPSULE | ORAL | Status: DC | PRN

## 2022-08-25 MED ORDER — OXYCODONE HCL 5 MG PO TABS
5.0000 mg | ORAL_TABLET | ORAL | Status: DC | PRN
  Administered 2022-08-26 – 2022-08-28 (×6): 5 mg via ORAL
  Filled 2022-08-25 (×3): qty 1
  Filled 2022-08-25: qty 2
  Filled 2022-08-25 (×2): qty 1

## 2022-08-25 MED ORDER — SODIUM CHLORIDE 0.9% FLUSH: 3.0000 mL | INTRAVENOUS | Status: DC | PRN

## 2022-08-25 MED ORDER — OXYTOCIN-SODIUM CHLORIDE 30-0.9 UT/500ML-% IV SOLN: 2.5000 [IU]/h | INTRAVENOUS | Status: AC

## 2022-08-25 MED ORDER — PHENYLEPHRINE HCL-NACL 20-0.9 MG/250ML-% IV SOLN
INTRAVENOUS | Status: DC | PRN
  Administered 2022-08-25: 50 ug/min via INTRAVENOUS

## 2022-08-25 MED ORDER — NALOXONE HCL 0.4 MG/ML IJ SOLN: 0.4000 mg | INTRAMUSCULAR | Status: DC | PRN

## 2022-08-25 MED ORDER — SOD CITRATE-CITRIC ACID 500-334 MG/5ML PO SOLN
ORAL | Status: AC
  Administered 2022-08-25: 30 mL via ORAL
  Filled 2022-08-25: qty 15

## 2022-08-25 MED ORDER — FENTANYL CITRATE (PF) 100 MCG/2ML IJ SOLN
INTRAMUSCULAR | Status: AC
  Filled 2022-08-25: qty 2

## 2022-08-25 MED ORDER — ACETAMINOPHEN 325 MG PO TABS
650.0000 mg | ORAL_TABLET | Freq: Four times a day (QID) | ORAL | Status: AC
  Administered 2022-08-25: 650 mg via ORAL
  Filled 2022-08-25: qty 2

## 2022-08-25 MED ORDER — BUPIVACAINE IN DEXTROSE 0.75-8.25 % IT SOLN
INTRATHECAL | Status: DC | PRN
  Administered 2022-08-25: 1.6 mL via INTRATHECAL

## 2022-08-25 MED ORDER — DIPHENHYDRAMINE HCL 50 MG/ML IJ SOLN: 12.5000 mg | INTRAMUSCULAR | Status: DC | PRN

## 2022-08-25 MED ORDER — COCONUT OIL OIL: 1.0000 | TOPICAL_OIL | Status: DC | PRN

## 2022-08-25 MED ORDER — MORPHINE SULFATE (PF) 0.5 MG/ML IJ SOLN
INTRAMUSCULAR | Status: DC | PRN
  Administered 2022-08-25: .1 mg via INTRATHECAL

## 2022-08-25 MED ORDER — SENNOSIDES-DOCUSATE SODIUM 8.6-50 MG PO TABS
2.0000 | ORAL_TABLET | ORAL | Status: DC
  Administered 2022-08-26 – 2022-08-28 (×3): 2 via ORAL
  Filled 2022-08-25 (×2): qty 2

## 2022-08-25 MED ORDER — FERROUS SULFATE 325 (65 FE) MG PO TABS
325.0000 mg | ORAL_TABLET | Freq: Two times a day (BID) | ORAL | Status: DC
  Administered 2022-08-26 – 2022-08-28 (×5): 325 mg via ORAL
  Filled 2022-08-25 (×5): qty 1

## 2022-08-25 MED ORDER — MENTHOL 3 MG MT LOZG: 1.0000 | LOZENGE | OROMUCOSAL | Status: DC | PRN

## 2022-08-25 MED ORDER — ACETAMINOPHEN 500 MG PO TABS
1000.0000 mg | ORAL_TABLET | Freq: Four times a day (QID) | ORAL | Status: DC
  Administered 2022-08-26 – 2022-08-28 (×8): 1000 mg via ORAL
  Filled 2022-08-25 (×9): qty 2

## 2022-08-25 SURGICAL SUPPLY — 27 items
CHLORAPREP W/TINT 26 (MISCELLANEOUS) ×1 IMPLANT
DRSG TELFA 3X8 NADH STRL (GAUZE/BANDAGES/DRESSINGS) ×1 IMPLANT
ELECT REM PT RETURN 9FT ADLT (ELECTROSURGICAL) ×1
ELECTRODE REM PT RTRN 9FT ADLT (ELECTROSURGICAL) ×1 IMPLANT
GAUZE SPONGE 4X4 12PLY STRL (GAUZE/BANDAGES/DRESSINGS) ×1 IMPLANT
GOWN STRL REUS W/ TWL LRG LVL3 (GOWN DISPOSABLE) ×3 IMPLANT
GOWN STRL REUS W/TWL LRG LVL3 (GOWN DISPOSABLE) ×3
MANIFOLD NEPTUNE II (INSTRUMENTS) ×1 IMPLANT
MAT PREVALON FULL STRYKER (MISCELLANEOUS) ×1 IMPLANT
NDL HYPO 25GX1X1/2 BEV (NEEDLE) ×1 IMPLANT
NEEDLE HYPO 25GX1X1/2 BEV (NEEDLE) ×1 IMPLANT
NS IRRIG 1000ML POUR BTL (IV SOLUTION) ×1 IMPLANT
PACK C SECTION AR (MISCELLANEOUS) ×1 IMPLANT
PAD OB MATERNITY 4.3X12.25 (PERSONAL CARE ITEMS) ×1 IMPLANT
PAD PREP 24X41 OB/GYN DISP (PERSONAL CARE ITEMS) ×1 IMPLANT
SCRUB CHG 4% DYNA-HEX 4OZ (MISCELLANEOUS) ×1 IMPLANT
SUT MNCRL 4-0 (SUTURE) ×1
SUT MNCRL 4-0 27XMFL (SUTURE) ×1
SUT VIC AB 0 CT1 36 (SUTURE) ×2 IMPLANT
SUT VIC AB 0 CTX 36 (SUTURE) ×2
SUT VIC AB 0 CTX36XBRD ANBCTRL (SUTURE) ×2 IMPLANT
SUT VIC AB 2-0 SH 27 (SUTURE) ×2
SUT VIC AB 2-0 SH 27XBRD (SUTURE) ×2 IMPLANT
SUTURE MNCRL 4-0 27XMF (SUTURE) ×1 IMPLANT
SYR 30ML LL (SYRINGE) ×2 IMPLANT
TRAP FLUID SMOKE EVACUATOR (MISCELLANEOUS) ×1 IMPLANT
WATER STERILE IRR 500ML POUR (IV SOLUTION) ×1 IMPLANT

## 2022-08-25 NOTE — Anesthesia Procedure Notes (Signed)
Spinal  Patient location during procedure: OR Start time: 08/25/2022 6:47 PM End time: 08/25/2022 6:49 PM Reason for block: surgical anesthesia Staffing Performed: resident/CRNA  Anesthesiologist: Piscitello, Precious Haws, MD Resident/CRNA: Aline Brochure, CRNA Performed by: Aline Brochure, CRNA Authorized by: Andria Frames, MD   Preanesthetic Checklist Completed: patient identified, IV checked, site marked, risks and benefits discussed, surgical consent, monitors and equipment checked, pre-op evaluation and timeout performed Spinal Block Patient position: sitting Prep: ChloraPrep Patient monitoring: heart rate, continuous pulse ox, blood pressure and cardiac monitor Approach: midline Location: L3-4 Injection technique: single-shot Needle Needle type: Whitacre and Introducer  Needle gauge: 24 G Needle length: 9 cm Assessment Sensory level: T10 Events: CSF return Additional Notes Sterile aseptic technique used throughout the procedure.  Negative paresthesia. Negative blood return. Positive free-flowing CSF. Expiration date of kit checked and confirmed. Patient tolerated procedure well, without complications.

## 2022-08-25 NOTE — Transfer of Care (Signed)
Immediate Anesthesia Transfer of Care Note  Patient: Kelly Stafford  Procedure(s) Performed: REPEAT CESAREAN SECTION WITH BILATERAL TUBAL LIGATION (Bilateral)  Patient Location: LDR6   Anesthesia Type:Spinal  Level of Consciousness: awake, alert , and oriented  Airway & Oxygen Therapy: Patient Spontanous Breathing  Post-op Assessment: Report given to RN and Post -op Vital signs reviewed and stable  Post vital signs: Reviewed and stable  Last Vitals:  Vitals Value Taken Time  BP 120/94   Temp    Pulse 75   Resp 11   SpO2 98     Last Pain:  Vitals:   08/25/22 1724  TempSrc: Oral  PainSc: 0-No pain         Complications: No notable events documented.

## 2022-08-25 NOTE — H&P (Addendum)
OB History & Physical   History of Present Illness:  Chief Complaint: elevated BP at MFM appt.   HPI:  Kelly Stafford is a 36 y.o. G33P1011 female at [redacted]w[redacted]d dated by LMP and c/w [place] at [redacted]w[redacted]d; EDD 09/12/22.  She presents to L&D for elevated BP noted in MFM appt today. Pt denies RUQ pain, VD or HA. Reports active FM; denies UCs, LOF or VB. But reports lower uterine burning and pain sensation.    MFM called [place] today with finding of oligohydramnios and recommendation for prompt delivery.  Pregnancy Issues: Low lying placenta- resolved H/o pre eclampsia Obesity Tobacco use Previous C-Section AMA Abnormal 1 hr gtt, normal 3 hr gtt [facility]- resolved   Maternal Medical History:   Past Medical History:  Diagnosis Date   Abnormal Pap smear of cervix 04/02/20 neg HPV+ 04/06/2020   Asthma    Gastric ulcer    History of C-section    fetal bradycardia, maternal HTN   History of hypertension    during a pregnancy   Hypertension     Past Surgical History:  Procedure Laterality Date   ADENOIDECTOMY     CESAREAN SECTION     INCISION AND DRAINAGE      infection at c-section site 2010   TONSILLECTOMY      No Known Allergies  Prior to Admission medications   Medication Sig Start Date End Date Taking? Authorizing Provider  acetaminophen (TYLENOL) 500 MG tablet Take 2 tablets (1,000 mg total) by mouth every 6 (six) hours as needed (for pain scale < 4  OR  temperature  >/=  100.5 F). 08/08/22  Yes Ed Blalock, CNM  Prenatal Vit-Fe Fumarate-FA (PRENATAL MULTIVITAMIN) TABS tablet Take 1 tablet by mouth daily at 12 noon.   Yes [provider]  aspirin 81 MG chewable tablet Chew 81 mg by mouth daily. Patient not taking: Reported on 08/18/2022    [provider]  folic acid (FOLVITE) 1 MG tablet Take 1 mg by mouth daily. Patient not taking: Reported on 08/25/2022    [provider]     Prenatal care site: [facility] History: She   reports that she has been smoking cigarettes. She has a 20.00 pack-year smoking history. She has never used smokeless tobacco. She reports that she does not currently use alcohol. She reports current drug use. Drugs: Marijuana and Methamphetamines.  Family History: family history includes Diabetes in her maternal grandmother and paternal grandmother; Hypertension in her father; Stroke in her father.   Review of Systems: A full review of systems was performed and negative except as noted in the HPI.     Physical Exam:  Vital Signs: BP (!) 156/92   Pulse 89   Temp 98.4 F (36.9 C) (Oral)   Resp 17   Ht 5\' 4"  (1.626 m)   Wt 126.1 kg   LMP 11/28/2021 (Exact Date)   BMI 47.72 kg/m   Vitals:   08/25/22 1710 08/25/22 1728 08/25/22 1742  BP: (!) 155/86 (!) 137/106 (!) 156/92     General: no acute distress.  HEENT: normocephalic, atraumatic; mild nasal congestion noted, pt reports feeling right ear "fullness"  Heart: regular rate & rhythm.  No murmurs/rubs/gallops Lungs: clear to auscultation bilaterally, normal respiratory effort Abdomen: soft, gravid, non-tender;  EFW: 9lbs Pelvic: deferred  Extremities: non-tender, symmetric, trace edema bilaterally.  DTRs: 1+  Neurologic: Alert & oriented x 3.    No results found for this or any  previous visit (from the past 24 hour(s)).  Pertinent Results:  Prenatal Labs: Blood type/Rh A Pos  Antibody screen neg  Rubella Immune  Varicella Immune  RPR NR  HBsAg Neg  HIV NR  GC neg  Chlamydia neg  Genetic screening negative  1 hour GTT   3 hour GTT   GBS  Neg   FHT: 130bpm, mod var, + accels, no decels TOCO: no UCs.  SVE:  deferred   Cephalic by leopolds/recent US    Assessment:  Kelly Stafford is a 36 y.o. G72P1011 female at [redacted]w[redacted]d with GHTN.   Plan:  1. Admit to Labor & Delivery; consents reviewed and obtained - reviewed persistent elevated BP with Dr Feliberto Gottron, previous CS, no Pre-E sx.  - CMP, CBC and urine P/C  pending.  - noted hx MJ and Meth use; UDS ordered.  - nasal congestion noted, no cough or fever; COVID/resp panel ordered.   2. Fetal Well being  - Fetal Tracing: Cat I tracing.  - Group B Streptococcus ppx indicated: Neg - Presentation: Cephalic confirmed by [place] today.    3. Routine OB: - Prenatal labs reviewed, as above - Rh A Pos - CBC, T&S, RPR on admit - Clear fluids, IVF  4. Plan for repeat CS - Dr Dalbert Garnet to perform - routine pre-op orders placed.   5. Post Partum Planning: - Infant feeding: TBD - Contraception: desires BTL vs IUD - Tdap 05/2022 - declined Flu  Randa Ngo, CNM 08/25/22 5:54 PM

## 2022-08-25 NOTE — Op Note (Signed)
Cesarean Section Procedure Note  Date of procedure: 08/25/2022   Pre-operative Diagnosis: Intrauterine pregnancy at [redacted]w[redacted]d;  - gHTN - oligohydramnios  Body mass index is 47.72 kg/m.  Post-operative Diagnosis: same, delivered. **Modified 22 for difficult case due to habitus  Procedure: Repeat Low Transverse Cesarean Section through Pfannenstiel incision, with bilateral tubal sterilization  Surgeon: Christeen Douglas, MD  Assistant(s):  Heloise Ochoa, CNM   Anesthesia: Spinal anesthesia  Anesthesiologist: Piscitello, Cleda Mccreedy, MD Anesthesiologist: Randa Ngo Cleda Mccreedy, MD CRNA: Irving Burton, CRNA  Estimated Blood Loss:           Drains: Foley Cath  Dressings: Provena wound vac placed         Total IV Fluids:  Urine Output: 7ml         Specimens: portion of left and right tubes with fimbriated ends         Complications:  None; patient tolerated the procedure well.         Disposition: PACU - hemodynamically stable.         Condition: stable  Findings:  A female infant in cephacli presentation. Amniotic fluid - Clear  Birth weight 3280 g.  Apgars of 9 and 9 at one and five minutes respectively.  Intact placenta with a three-vessel cord.  Grossly normal uterus, tubes and ovaries bilaterally. Moderate intraabdominal adhesions were noted.  Case was difficult due to pannus positioning. Traxi used.  Indications:  prior cesarean section  Procedure Details  The patient was taken to Operating Room, identified as the correct patient and the procedure verified as C-Section Delivery. A formal Time Out was held with all team members present and in agreement.  After induction of anesthesia, the patient was draped and prepped in the usual sterile manner. A Traxi pannus retractor was placed. A Pfannenstiel skin incision was made and carried down through the subcutaneous tissue to the fascia. Fascial incision was made and extended transversely with the Mayo  scissors. The fascia was separated from the underlying rectus tissue superiorly and inferiorly. Omental adhesions in the midline.  The peritoneum was identified and entered bluntly. Peritoneal incision was extended longitudinally. The utero-vesical peritoneal reflection was incised transversely and a bladder flap was created digitally. Alexis placed.  A low transverse hysterotomy was made. The fetus was delivered atraumatically. The umbilical cord was clamped x2 and cut and the infant was handed to the awaiting pediatricians. The placenta was removed intact and appeared normal, intact, and with a 3-vessel cord.   The uterus was exteriorized and cleared of all clot and debris. The hysterotomy was closed with running sutures of 0-Vicryl. A second imbricating layer was placed with the same suture. Excellent hemostasis was observed. The peritoneal cavity was cleared of all clots and debris.   [place] modified to include fimbriated ends on both tubes.  The uterus was returned to the abdomen.   The pelvis was irrigated and again, excellent hemostasis was noted. The fascia was then reapproximated with running sutures of 0 Vicryl.  The subcutaneous tissue was reapproximated with running sutures of 0 Vicryl in two layers. The skin was reapproximated with Ensorb absorbable sutures. 40ml ( 0.5% bupivicaine and 88ml of NSS) of bupivicaine placed in the fascial and skin lines.  Provena wound vac placed using sterile technique.  Instrument, sponge, and needle counts were correct prior to the abdominal closure and at the conclusion of the case.   The patient tolerated the procedure well and was transferred to the recovery room in stable condition.  Benjaman Kindler, MD 08/25/2022

## 2022-08-25 NOTE — Discharge Summary (Signed)
Obstetrical Discharge Summary  Patient Name: Kelly Stafford DOB: April 12, 1986 MRN: [id number]  Date of Admission: 08/25/2022 Date of Delivery: 08/25/22 Delivered by: Dalbert Garnet MD Date of Discharge: 08/28/2022  Primary OB: [facility] [id number] last menstrual period was 11/28/2021 (exact date). EDC Estimated Date of Delivery: 09/12/22 Gestational Age at Delivery: [redacted]w[redacted]d   Antepartum complications:  Low lying placenta- resolved H/o pre eclampsia Obesity Tobacco use Previous C-Section AMA Abnormal 1 hr gtt, normal 3 hr gtt [facility]- resolved  Admitting Diagnosis: Gestational hypertension w/o significant proteinuria in 3rd trimester [O13.3] Oligohydramnios, 37wks  Secondary Diagnosis: Patient Active Problem List   Diagnosis Date Noted   Gestational hypertension w/o significant proteinuria in 3rd trimester 08/25/2022   Bilateral hand pain 08/08/2022   Supervision of high risk pregnancy in third trimester 02/09/2022   Abnormal Pap smear of cervix 04/02/20 neg HPV+ 04/06/2020   Obesity BMI=39.6 04/02/2020   Elevated blood pressure reading without diagnosis of hypertension 137/96, 134/97 on 04/02/20 04/02/2020    Meth use--last 01/2020 04/02/2020   Marijuana use daily 04/02/2020   Smoker 1/2-1 ppd 04/02/2020   Hx of preeclampsia 04/02/2020   Stomach ulcer 04/02/2020    Discharge Diagnosis: Term Pregnancy Delivered and Gestational Hypertension      Augmentation: N/A Complications: None Intrapartum complications/course: see Op notes Delivery Type: repeat cesarean section, low transverse incision Anesthesia: spinal anesthesia Placenta: manual removal To Pathology: No  Laceration: n/a Episiotomy: none Newborn Data: Live born female  Birth Weight: 7 lb 3.7 oz (3280 g) APGAR: 9, 9  Newborn Delivery   Birth date/time: 08/25/2022 19:23:00 Delivery type: C-Section, Low Transverse Trial of labor: No C-section categorization: Repeat      Postpartum Procedures: P.P. tubal  ligation Edinburgh:     08/26/2022    9:58 AM  Inocente Salles Postnatal Depression Scale Screening Tool  I have been able to laugh and see the funny side of things. 0  I have looked forward with enjoyment to things. 0  I have blamed myself unnecessarily when things went wrong. 0  I have been anxious or worried for no good reason. 0  I have felt scared or panicky for no good reason. 0  Things have been getting on top of me. 0  I have been so unhappy that I have had difficulty sleeping. 0  I have felt sad or miserable. 0  I have been so unhappy that I have been crying. 0  The thought of harming myself has occurred to me. 0  Edinburgh Postnatal Depression Scale Total 0     Post partum course(Cesarean Section):  Patient had an uncomplicated postpartum course.  By time of discharge on POD#3, her pain was controlled on oral pain medications; she had appropriate lochia and was ambulating, voiding without difficulty, tolerating regular diet and passing flatus.   Blood pressures were controlled with oral antihypertensives.  She was given one dose of lasix PO for postpartum edema. She was deemed stable for discharge to home.    Discharge Physical Exam:  BP (!) 144/85 (BP Location: Left Arm)   Pulse 88   Temp 97.7 F (36.5 C)   Resp 20   Ht 5\' 4"  (1.626 m)   Wt 126.1 kg   LMP 11/28/2021 (Exact Date)   SpO2 99% Comment: R/A  Breastfeeding Unknown   BMI 47.72 kg/m   General: NAD CV: RRR Pulm: CTABL, nl effort ABD: s/nd/nt, fundus firm and below the umbilicus Lochia: moderate Incision: c/d/I, covered with occlusive OP site dressing; Wound vac in  place DVT Evaluation: LE non-ttp, no evidence of DVT on exam.  Hemoglobin  Date Value Ref Range Status  08/26/2022 10.5 (L) 12.0 - 15.0 g/dL Final   HGB  Date Value Ref Range Status  11/04/2013 15.8 12.0 - 16.0 g/dL Final   HCT  Date Value Ref Range Status  08/26/2022 31.7 (L) 36.0 - 46.0 % Final  11/04/2013 47.8 (H) 35.0 - 47.0 % Final     Risk assessment for postpartum VTE and prophylactic treatment: Very high risk factors: None High risk factors: BMI 40-50 kg/m2 Moderate risk factors: Cesarean delivery   Postpartum VTE prophylaxis with LMWH not indicated  Disposition: stable, discharge to home. Baby Feeding: formula feeding Baby Disposition: home with mom  Rh Immune globulin indicated: No Rubella vaccine given: was not indicated Varivax vaccine given: was not indicated Flu vaccine given in AP setting: No Tdap vaccine given in AP setting: Yes   Contraception: bilateral tubal ligation done with c/s  Prenatal Labs:   Blood type/Rh A Pos  Antibody screen neg  Rubella Immune  Varicella Immune  RPR NR  HBsAg Neg  HIV NR  GC neg  Chlamydia neg  Genetic screening negative  1 hour GTT  150  3 hour GTT  90, 191,136,49   GBS  Neg     Plan:  MELENDA BIELAK was discharged to home in good condition. Follow-up appointment with delivering provider in 5-7 days for Proveena removal and blood pressure check.   Discharge Medications: Allergies as of 08/28/2022   No Known Allergies      Medication List     STOP taking these medications    aspirin 81 MG chewable tablet   folic acid 1 MG tablet Commonly known as: FOLVITE       TAKE these medications    acetaminophen 500 MG tablet Commonly known as: TYLENOL Take 2 tablets (1,000 mg total) by mouth every 6 (six) hours as needed for mild pain. What changed: reasons to take this   hydrochlorothiazide 12.5 MG tablet Commonly known as: HYDRODIURIL Take 1 tablet (12.5 mg total) by mouth daily.   ibuprofen 600 MG tablet Commonly known as: ADVIL Take 1 tablet (600 mg total) by mouth every 6 (six) hours as needed for mild pain or cramping.   NIFEdipine 90 MG 24 hr tablet Commonly known as: PROCARDIA XL/NIFEDICAL-XL Take 1 tablet (90 mg total) by mouth daily.   oxyCODONE 5 MG immediate release tablet Commonly known as: Oxy IR/ROXICODONE Take 1  tablet (5 mg total) by mouth every 4 (four) hours as needed for up to 7 days for moderate pain.   prenatal multivitamin Tabs tablet Take 1 tablet by mouth daily at 12 noon.         Follow-up Information     Benjaman Kindler, MD Follow up in 2 week(s).   Specialty: Obstetrics and Gynecology Why: For postop check Contact information: [address] [place] [address] [phone]         Benjaman Kindler, MD. Schedule an appointment as soon as possible for a visit.   Specialty: Obstetrics and Gynecology Why: 5-7 days for proveena removal and blood pressure check Contact information: [place] [address] [phone]                 Signed: Minda Meo, CNM 08/28/2022 8:36 AM  Drinda Butts, CNM Certified Nurse Midwife [facility]

## 2022-08-25 NOTE — Anesthesia Preprocedure Evaluation (Signed)
Anesthesia Evaluation  Patient identified by MRN, date of birth, ID band Patient awake    Reviewed: Allergy & Precautions, NPO status , Patient's Chart, lab work & pertinent test results  History of Anesthesia Complications Negative for: history of anesthetic complications  Airway Mallampati: II  TM Distance: >3 FB Neck ROM: full    Dental  (+) Chipped   Pulmonary neg shortness of breath, asthma , Current Smoker   Pulmonary exam normal        Cardiovascular Exercise Tolerance: Good hypertension, (-) angina negative cardio ROS Normal cardiovascular exam     Neuro/Psych    GI/Hepatic negative GI ROS,neg GERD  ,,  Endo/Other    Renal/GU   negative genitourinary   Musculoskeletal   Abdominal   Peds  Hematology negative hematology ROS (+)   Anesthesia Other Findings Labor in setting of prior C section  Past Medical History: 04/06/2020: Abnormal Pap smear of cervix 04/02/20 neg HPV+ No date: Asthma No date: Gastric ulcer No date: History of C-section     Comment:  fetal bradycardia, maternal HTN No date: History of hypertension     Comment:  during a pregnancy No date: Hypertension  Past Surgical History: No date: ADENOIDECTOMY No date: CESAREAN SECTION No date: INCISION AND DRAINAGE     Comment:   infection at c-section site 2010 No date: TONSILLECTOMY  BMI    Body Mass Index: 47.72 kg/m      Reproductive/Obstetrics (+) Pregnancy                             Anesthesia Physical Anesthesia Plan  ASA: 3 and emergent  Anesthesia Plan: Spinal   Post-op Pain Management:    Induction:   PONV Risk Score and Plan:   Airway Management Planned: Natural Airway and Nasal Cannula  Additional Equipment:   Intra-op Plan:   Post-operative Plan:   Informed Consent: I have reviewed the patients History and Physical, chart, labs and discussed the procedure including the risks,  benefits and alternatives for the proposed anesthesia with the patient or authorized representative who has indicated his/her understanding and acceptance.     Dental Advisory Given  Plan Discussed with: Anesthesiologist, CRNA and Surgeon  Anesthesia Plan Comments: (Patient reports no bleeding problems and no anticoagulant use.  Plan for spinal with backup GA  Patient consented for risks of anesthesia including but not limited to:  - adverse reactions to medications - damage to eyes, teeth, lips or other oral mucosa - nerve damage due to positioning  - risk of bleeding, infection and or nerve damage from spinal that could lead to paralysis - risk of headache or failed spinal - damage to teeth, lips or other oral mucosa - sore throat or hoarseness - damage to heart, brain, nerves, lungs, other parts of body or loss of life  Patient voiced understanding.)       Anesthesia Quick Evaluation

## 2022-08-26 LAB — CBC
HCT: 31.7 % — ABNORMAL LOW (ref 36.0–46.0)
Hemoglobin: 10.5 g/dL — ABNORMAL LOW (ref 12.0–15.0)
MCH: 29.2 pg (ref 26.0–34.0)
MCHC: 33.1 g/dL (ref 30.0–36.0)
MCV: 88.3 fL (ref 80.0–100.0)
Platelets: 278 10*3/uL (ref 150–400)
RBC: 3.59 MIL/uL — ABNORMAL LOW (ref 3.87–5.11)
RDW: 14.6 % (ref 11.5–15.5)
WBC: 14.1 10*3/uL — ABNORMAL HIGH (ref 4.0–10.5)
nRBC: 0 % (ref 0.0–0.2)

## 2022-08-26 LAB — RPR: RPR Ser Ql: NONREACTIVE

## 2022-08-26 MED ORDER — NIFEDIPINE ER OSMOTIC RELEASE 30 MG PO TB24
30.0000 mg | ORAL_TABLET | Freq: Every day | ORAL | Status: DC
  Administered 2022-08-26 – 2022-08-27 (×2): 30 mg via ORAL
  Filled 2022-08-26 (×2): qty 1

## 2022-08-26 NOTE — Anesthesia Post-op Follow-up Note (Signed)
  Anesthesia Pain Follow-up Note  Patient: Kelly Stafford  Day #: 1  Date of Follow-up: 08/26/2022 Time: 8:04 AM  Last Vitals:  Vitals:   08/26/22 0540 08/26/22 0746  BP:  131/76  Pulse:  73  Resp:  18  Temp:  36.8 C  SpO2: 97% 98%    Level of Consciousness: alert  Pain: none   Side Effects:None  Catheter Site Exam:clean, dry, no drainage  Anti-Coag Meds (From admission, onward)    Start     Dose/Rate Route Frequency Ordered Stop   08/26/22 1500  enoxaparin (LOVENOX) injection 40 mg        40 mg Subcutaneous Every 24 hours 08/25/22 2340          Plan: D/C from anesthesia care at surgeon's request  [place]

## 2022-08-26 NOTE — Anesthesia Postprocedure Evaluation (Signed)
Anesthesia Post Note  Patient: Kelly Stafford  Procedure(s) Performed: REPEAT CESAREAN SECTION WITH BILATERAL TUBAL LIGATION (Bilateral)  Patient location during evaluation: Mother Baby Anesthesia Type: Spinal Level of consciousness: oriented and awake and alert Pain management: pain level controlled Vital Signs Assessment: post-procedure vital signs reviewed and stable Respiratory status: spontaneous breathing and respiratory function stable Cardiovascular status: blood pressure returned to baseline and stable Postop Assessment: no headache, no backache, no apparent nausea or vomiting and able to ambulate Anesthetic complications: no  No notable events documented.   Last Vitals:  Vitals:   08/26/22 0540 08/26/22 0746  BP:  131/76  Pulse:  73  Resp:  18  Temp:  36.8 C  SpO2: 97% 98%    Last Pain:  Vitals:   08/26/22 0746  TempSrc: Oral  PainSc:                  Jerrye Noble

## 2022-08-26 NOTE — Progress Notes (Signed)
Post Partum Day 1  Subjective: Doing well, no concerns. Ambulating without difficulty, pain managed with PO meds, tolerating regular diet, and voiding without difficulty.   No fever/chills, chest pain, shortness of breath, nausea/vomiting, or leg pain. No nipple or breast pain.   Objective: BP 131/76 (BP Location: Right Arm)   Pulse 73   Temp 98.3 F (36.8 C) (Oral)   Resp 18   Ht 5\' 4"  (1.626 m)   Wt 126.1 kg   LMP 11/28/2021 (Exact Date)   SpO2 98%   Breastfeeding Unknown   BMI 47.72 kg/m    Physical Exam:  General: alert and cooperative Breasts: soft/nontender CV: RRR Pulm: nl effort Abdomen: soft, non-tender Uterine Fundus: firm Incision: Wound vac in place with minimal to no drainage Perineum: intact Lochia: appropriate DVT Evaluation: No evidence of DVT seen on physical exam. Edinburgh:      No data to display           Recent Labs    08/25/22 1738 08/26/22 0635  HGB 11.2* 10.5*  HCT 33.8* 31.7*  WBC 13.4* 14.1*  PLT 332 278    Assessment/Plan: 36 y.o. [id number] postpartum day # 1  -Continue routine postpartum care -Lactation consult PRN for breastfeeding   -BTL completed with c/s -Acute blood loss anemia - hemodynamically stable and asymptomatic; start PO ferrous sulfate BID with stool softeners.  -Immunization status:  all immunizations up to date  Disposition: Continue inpatient postpartum care    LOS: 1 day   Nahiem Dredge, CNM 08/26/2022, 9:13 AM

## 2022-08-27 ENCOUNTER — Encounter: Payer: Self-pay | Admitting: Obstetrics and Gynecology

## 2022-08-27 MED ORDER — NIFEDIPINE ER OSMOTIC RELEASE 30 MG PO TB24
30.0000 mg | ORAL_TABLET | Freq: Once | ORAL | Status: AC
  Administered 2022-08-27: 30 mg via ORAL
  Filled 2022-08-27: qty 1

## 2022-08-27 MED ORDER — NIFEDIPINE ER OSMOTIC RELEASE 30 MG PO TB24: 60.0000 mg | ORAL_TABLET | Freq: Every day | ORAL | Status: DC

## 2022-08-27 MED ORDER — FUROSEMIDE 20 MG PO TABS
20.0000 mg | ORAL_TABLET | Freq: Once | ORAL | Status: AC
  Administered 2022-08-27: 20 mg via ORAL
  Filled 2022-08-27: qty 1

## 2022-08-27 NOTE — Plan of Care (Signed)
Pt. Is PP 

## 2022-08-27 NOTE — Progress Notes (Signed)
Postop Day  2  Subjective: no complaints, up ad lib, voiding, and tolerating PO  Doing well, no concerns. Ambulating without difficulty, pain managed with PO meds, tolerating regular diet, and voiding without difficulty.   No fever/chills, chest pain, shortness of breath, nausea/vomiting, or leg pain. No nipple or breast pain. No headache, visual changes, or RUQ/epigastric pain.  Objective: BP 128/71 (BP Location: Left Arm)   Pulse 80   Temp 98.1 F (36.7 C) (Oral)   Resp 18   Ht 5\' 4"  (1.626 m)   Wt 126.1 kg   LMP 11/28/2021 (Exact Date)   SpO2 98%   Breastfeeding Unknown   BMI 47.72 kg/m    Vitals:   08/25/22 2348 08/26/22 0140 08/26/22 0311 08/26/22 0746  BP: 137/71 130/80 (!) 146/77 131/76   08/26/22 1125 08/26/22 1608 08/26/22 2005 08/26/22 2143  BP: 133/83 134/78 (!) 159/92 (!) 144/83   08/27/22 0004 08/27/22 0049 08/27/22 0320 08/27/22 0745  BP: (!) 154/97 (!) 145/85 132/76 128/71     Physical Exam:  General: alert, cooperative, and no distress Breasts: soft/nontender CV: RRR Pulm: nl effort, CTABL Abdomen: soft, non-tender, active bowel sounds Extremities: 2+ Bilateral dependent edema  Uterine Fundus: firm Incision: no significant drainage, covered with occlusive dressing Perineum: minimal edema, intact Lochia: appropriate DVT Evaluation: No evidence of DVT seen on physical exam.  Recent Labs    08/25/22 1738 08/26/22 0635  HGB 11.2* 10.5*  HCT 33.8* 31.7*  WBC 13.4* 14.1*  PLT 332 278    Assessment/Plan: 36 y.o. [id number] postpartum day # 2  -Continue routine postpartum care -Lactation consult PRN for breastfeeding  -Acute blood loss anemia - hemodynamically stable and asymptomatic; start PO ferrous sulfate BID with stool softeners  -Immunization status:   all immunizations up to date -Blood pressures normal to mild range - taking Procardia 30 mg daily  -Significant edema in lower extremities - one dose of PO lasix ordered    Disposition:  Continue inpatient postpartum care. Possible discharge later today if BP's controlled.    LOS: 2 days   Minda Meo, CNM 08/27/2022, 9:04 AM   ----- Drinda Butts  Certified Nurse Midwife [facility]

## 2022-08-28 MED ORDER — NIFEDIPINE ER OSMOTIC RELEASE 90 MG PO TB24: 90.0000 mg | ORAL_TABLET | Freq: Every day | ORAL | 1 refills | Status: AC

## 2022-08-28 MED ORDER — HYDROCHLOROTHIAZIDE 12.5 MG PO TABS: 12.5000 mg | ORAL_TABLET | Freq: Every day | ORAL | 1 refills | Status: AC

## 2022-08-28 MED ORDER — IBUPROFEN 600 MG PO TABS: 600.0000 mg | ORAL_TABLET | Freq: Four times a day (QID) | ORAL | 1 refills | Status: AC | PRN

## 2022-08-28 MED ORDER — ACETAMINOPHEN 500 MG PO TABS: 1000.0000 mg | ORAL_TABLET | Freq: Four times a day (QID) | ORAL | 0 refills | Status: AC | PRN

## 2022-08-28 MED ORDER — NIFEDIPINE ER OSMOTIC RELEASE 30 MG PO TB24
90.0000 mg | ORAL_TABLET | Freq: Every day | ORAL | Status: DC
  Administered 2022-08-28: 90 mg via ORAL
  Filled 2022-08-28: qty 3

## 2022-08-28 MED ORDER — OXYCODONE HCL 5 MG PO TABS: 5.0000 mg | ORAL_TABLET | ORAL | 0 refills | Status: AC | PRN

## 2022-08-28 MED ORDER — HYDROCHLOROTHIAZIDE 12.5 MG PO TABS
12.5000 mg | ORAL_TABLET | Freq: Every day | ORAL | Status: DC
  Administered 2022-08-28: 12.5 mg via ORAL
  Filled 2022-08-28: qty 1

## 2022-08-28 NOTE — Progress Notes (Signed)
D/C home via car with friend/pt verb understanding of d/c instructions

## 2022-08-29 ENCOUNTER — Inpatient Hospital Stay: Admission: RE | Admit: 2022-08-29 | Payer: Medicaid Other | Source: Ambulatory Visit

## 2022-08-29 LAB — SURGICAL PATHOLOGY

## 2022-09-06 ENCOUNTER — Inpatient Hospital Stay: Admit: 2022-09-06 | Payer: Medicaid Other | Admitting: Obstetrics and Gynecology

## 2023-04-11 IMAGING — CR DG CHEST 2V
2 series · 2 of 2 positions shown · non-contrast
Comparison: Chest radiograph January 16, 2012.

CLINICAL DATA: Productive cough, fever and chills.

EXAM:
CHEST - 2 VIEW

[chest pa]
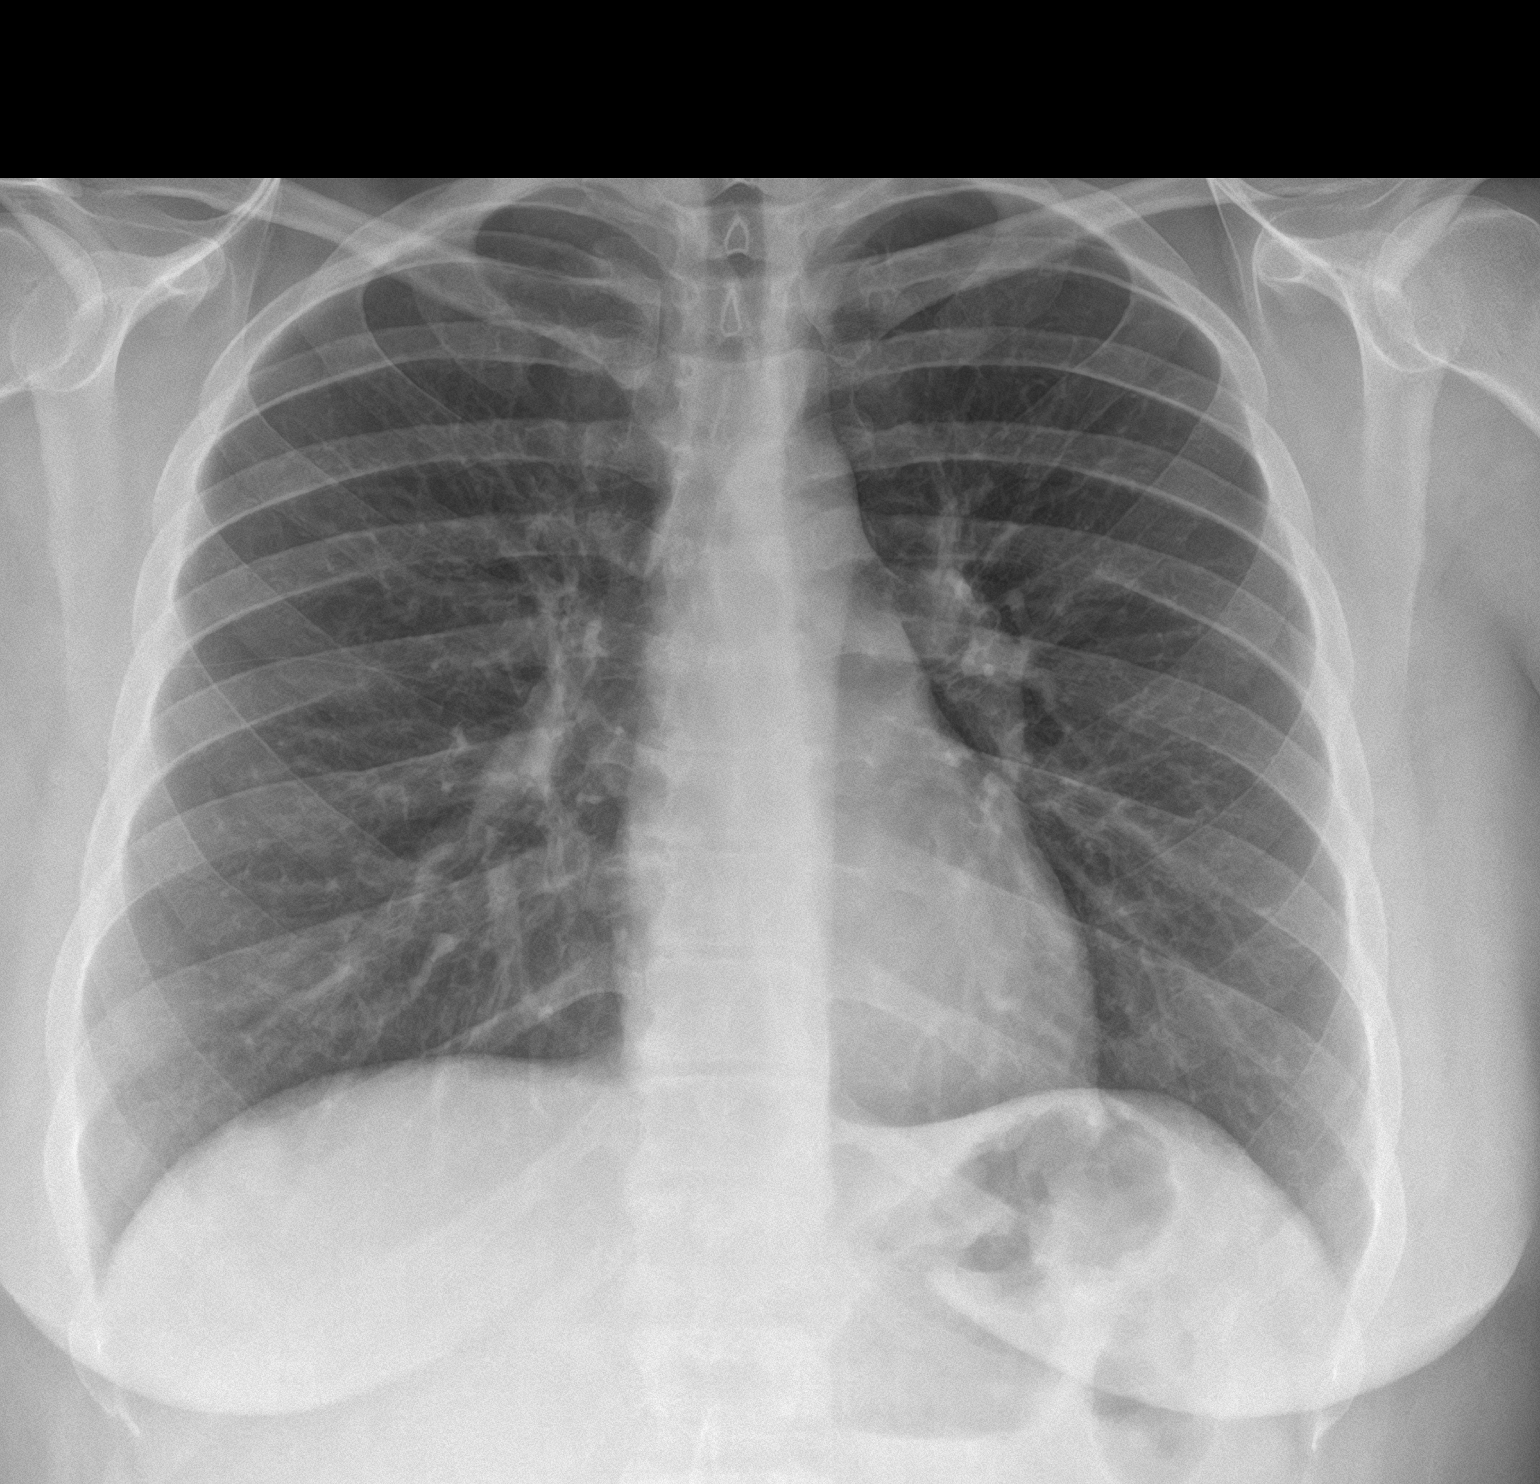

[chest lat]
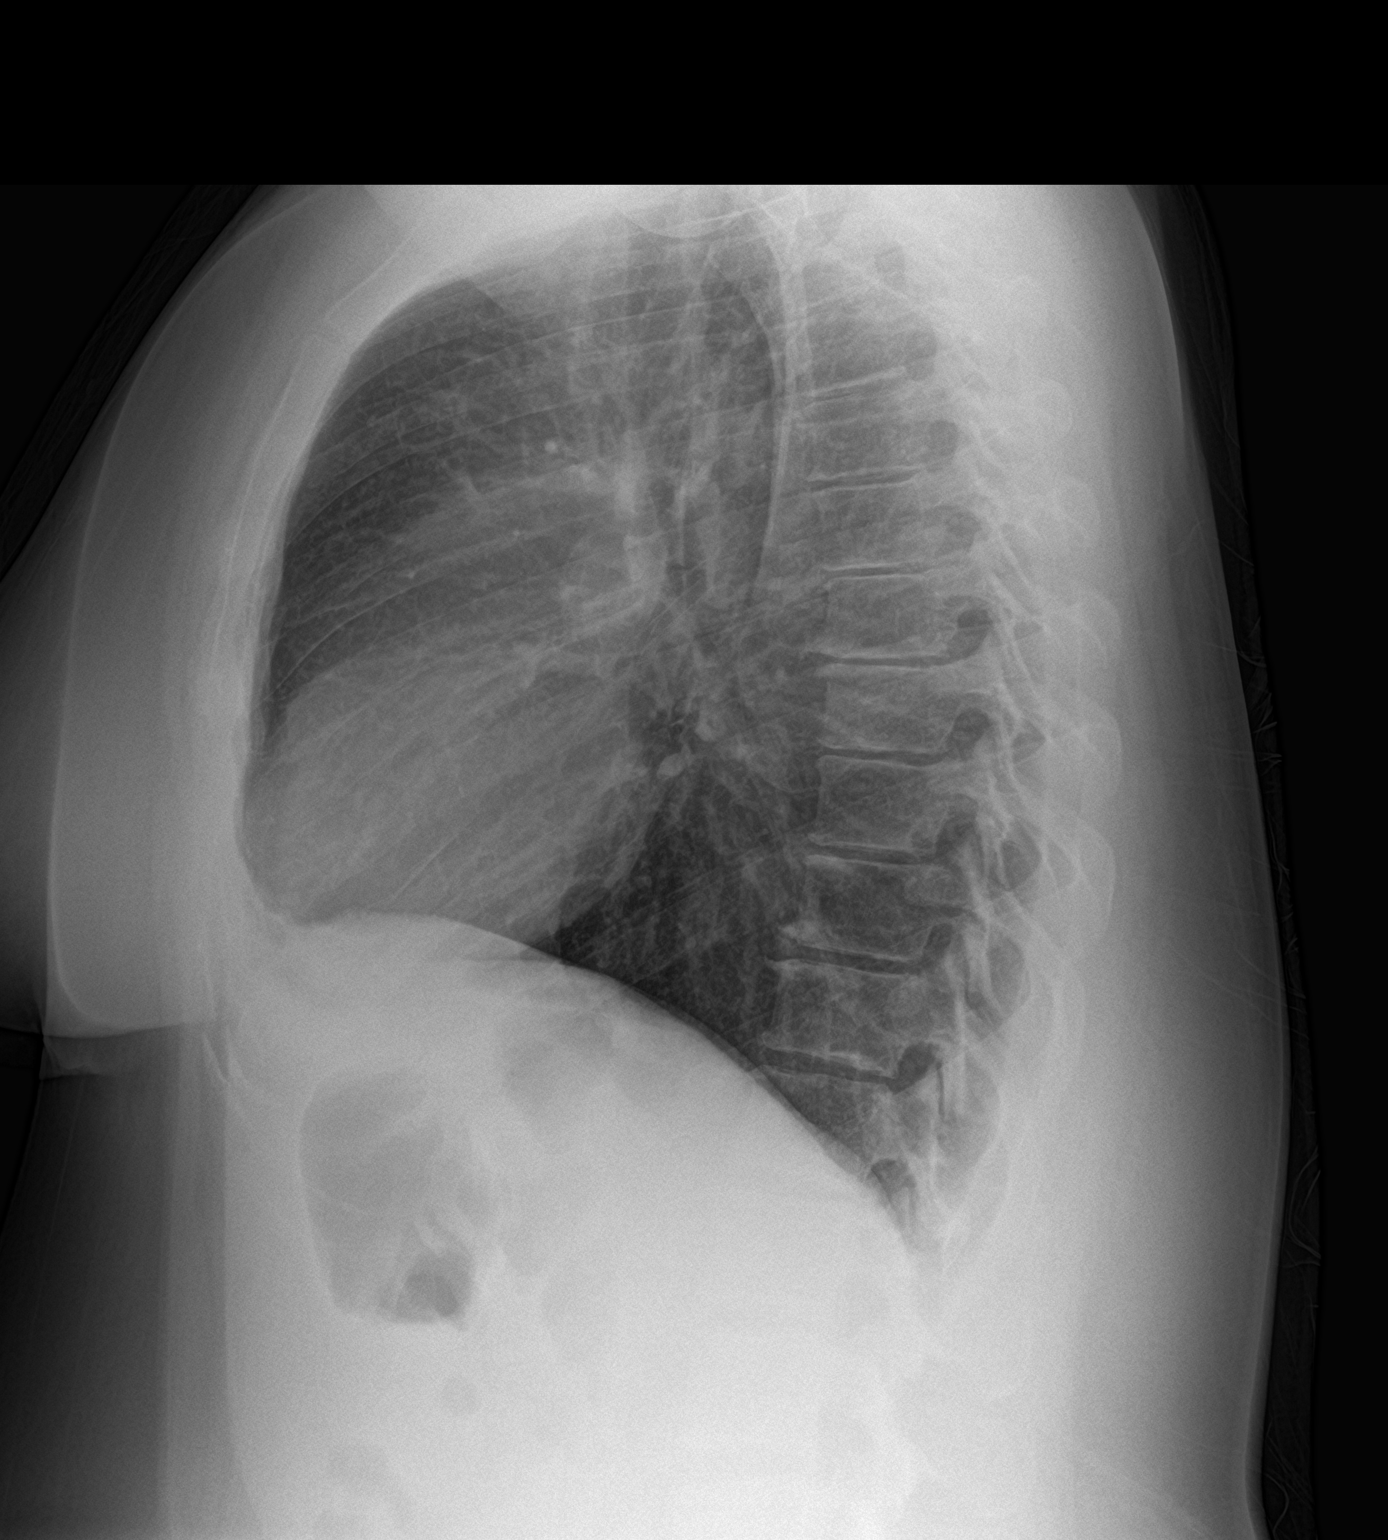

[2 of 2 positions shown; findings below may reference images not displayed]

FINDINGS: The heart size and mediastinal contours are within normal limits.
Both lungs are clear. The visualized skeletal structures are
unremarkable.
IMPRESSION: No active cardiopulmonary disease.

## 2023-06-19 IMAGING — US US OB < 14 WEEKS - US OB TV
1 series · 14 of 28 positions shown · non-contrast
Comparison: None.

CLINICAL DATA: Lower back pain for 2 weeks.

EXAM:
OBSTETRIC <14 WK US AND TRANSVAGINAL OB US
TECHNIQUE: Both transabdominal and transvaginal ultrasound examinations were
performed for complete evaluation of the gestation as well as the
maternal uterus, adnexal regions, and pelvic cul-de-sac.
Transvaginal technique was performed to assess early pregnancy.

[Series 1: us ob less than 14 weeks with ob transvaginal · 14 of 115 slices shown]
[im 5/115]
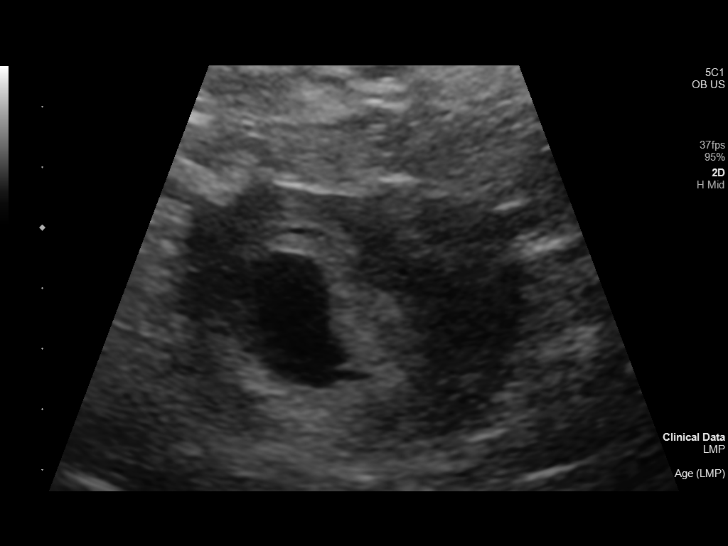
[im 13/115]
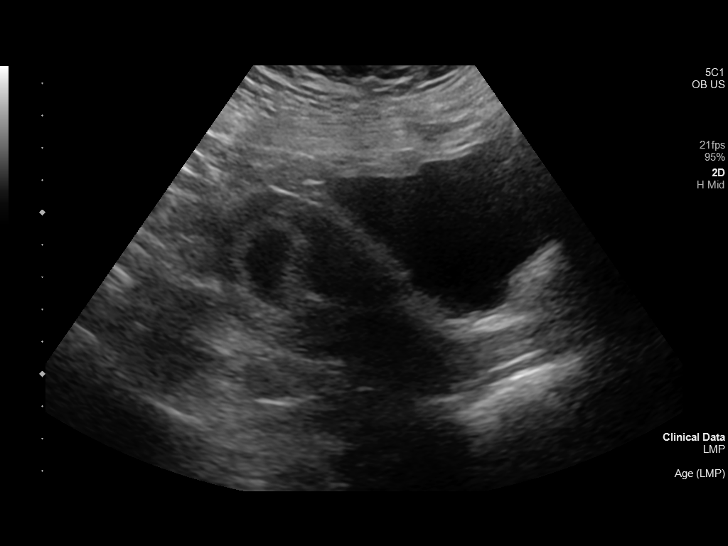
[im 22/115]
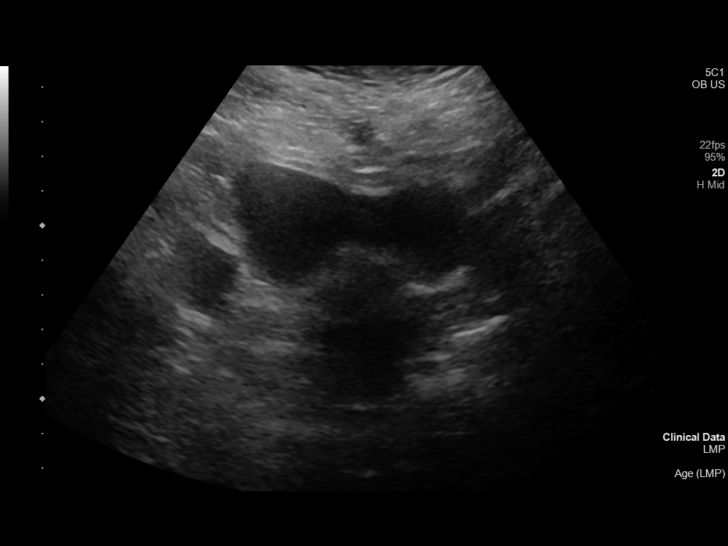
[im 30/115]
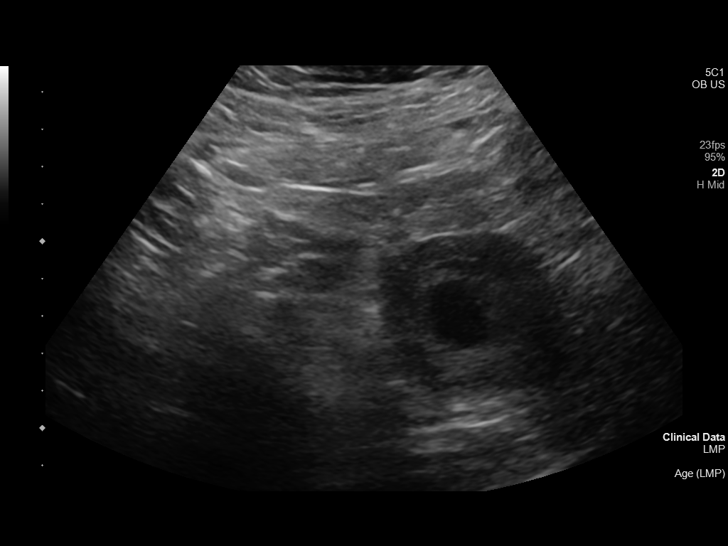
[im 39/115]
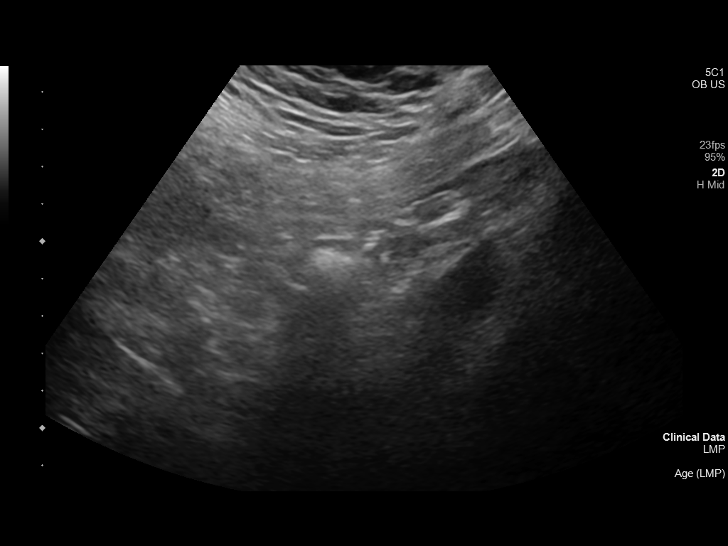
[im 47/115]
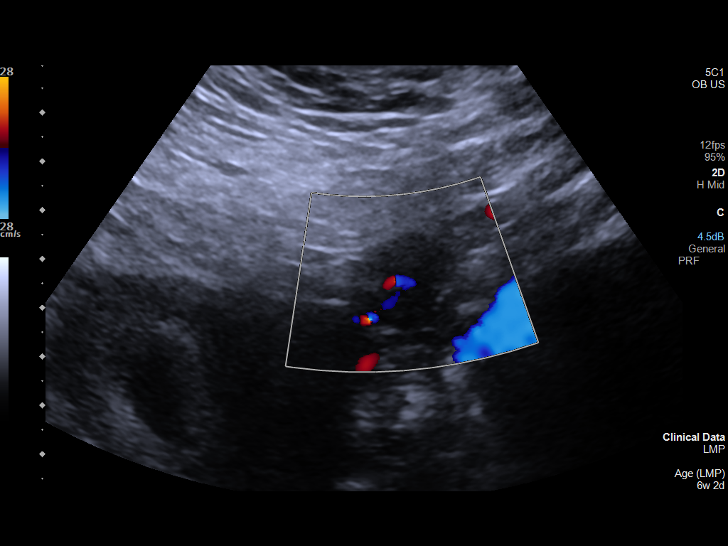
[im 55/115]
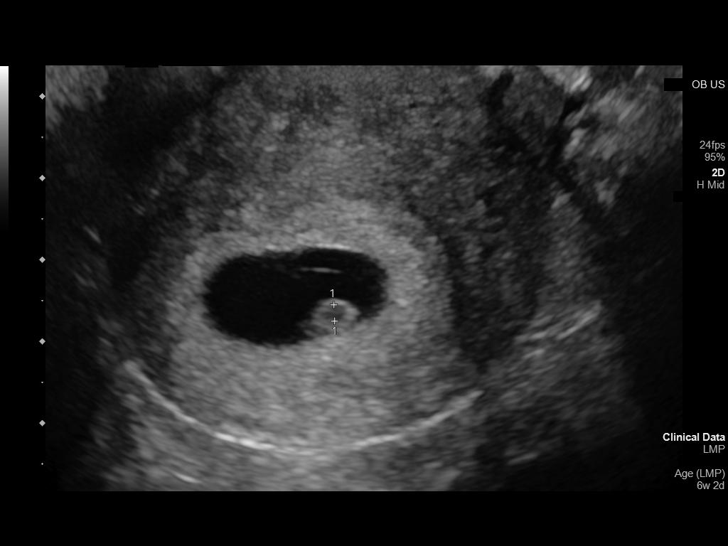
[im 64/115]
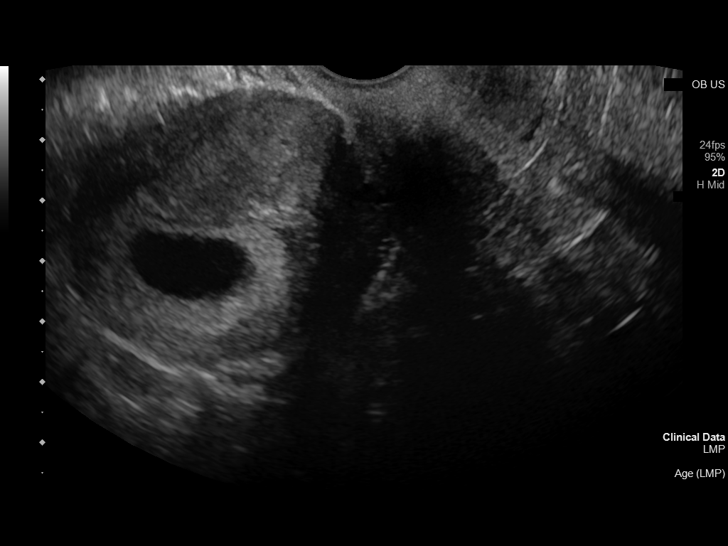
[im 72/115]
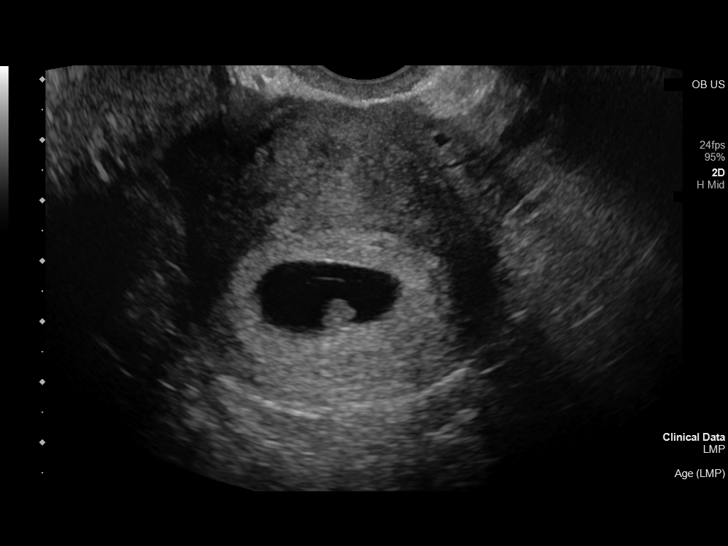
[im 81/115]
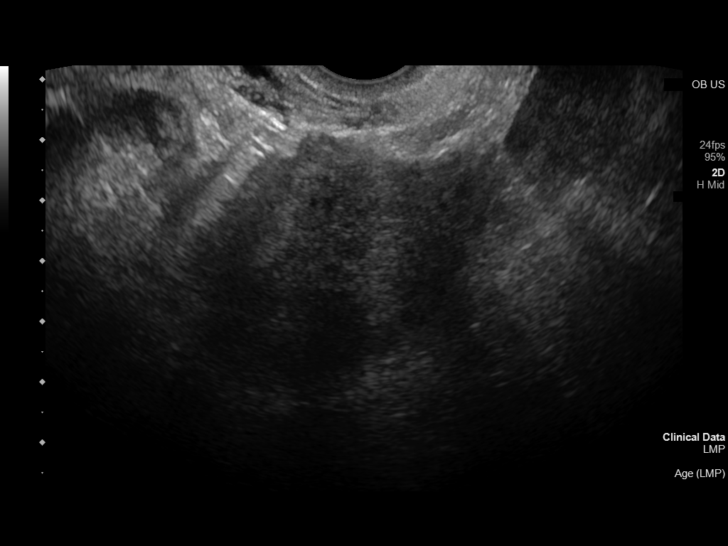
[im 89/115]
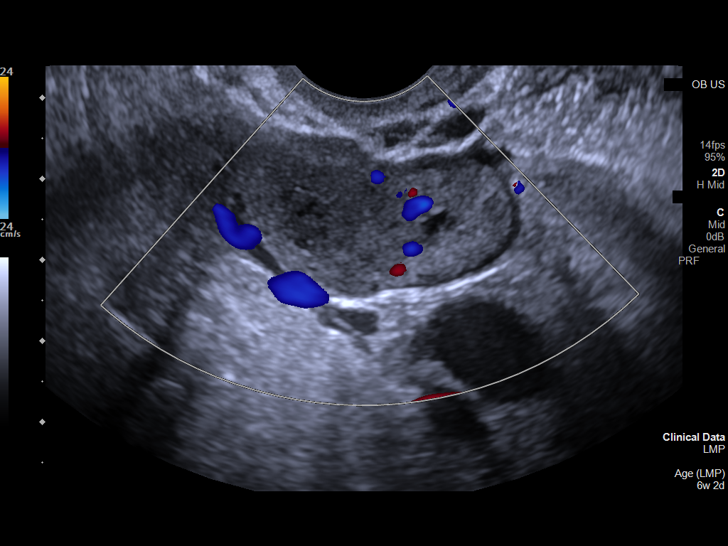
[im 98/115]
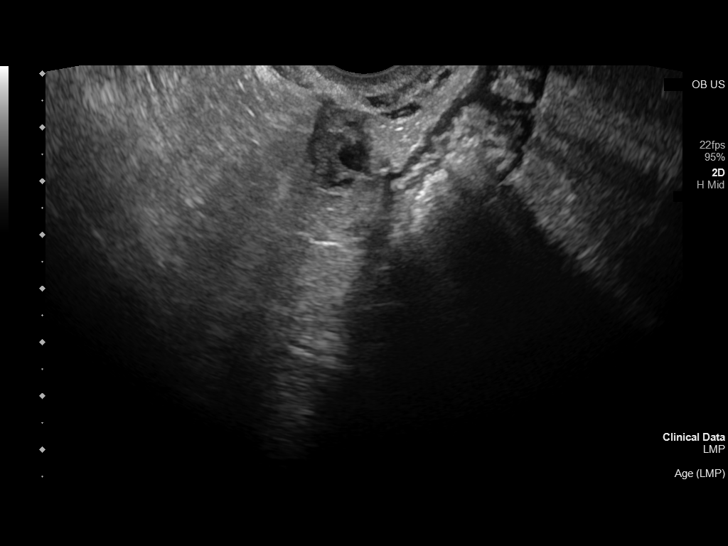
[im 106/115]
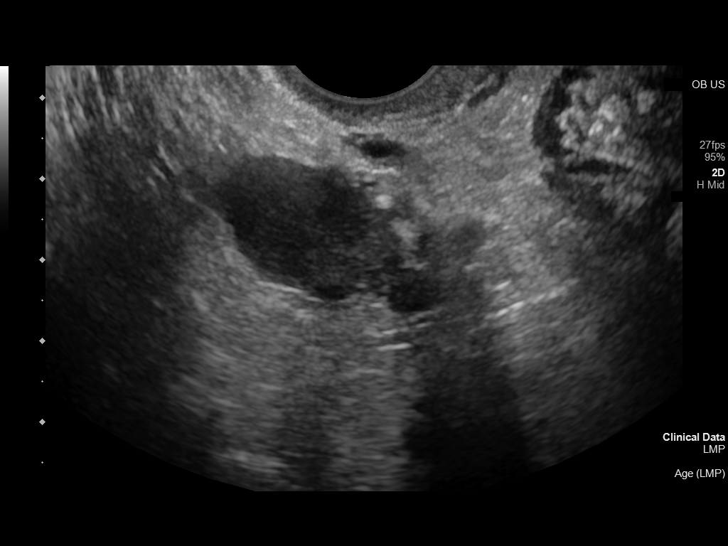
[im 115/115]
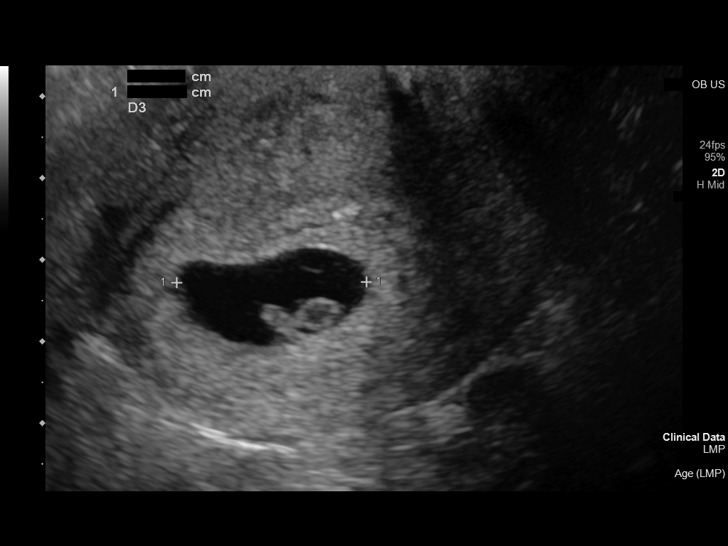

[14 of 28 positions shown; findings below may reference images not displayed]

FINDINGS: Intrauterine gestational sac: Single

Yolk sac:  Visualized.

Embryo:  Visualized.

Cardiac Activity: Visualized.

Heart Rate: 114 bpm

CRL:  6.2 mm   6 w   3 d                  US EDC: 09/12/2022

Subchorionic hemorrhage:  None visualized.

Maternal uterus/adnexae: Likely corpus luteal cyst in the right
ovary measuring up to 2.2 cm.
IMPRESSION: 1. Single live intrauterine gestation with approximate gestational
age of 6 weeks and 3 days, EDC based on today's sonogram is
09/12/2022.
2. Uterus and bilateral adnexa are unremarkable.
